# Patient Record
Sex: Female | Born: 1970 | Race: Black or African American | Hispanic: No | Smoking: Never smoker
Health system: Southern US, Community
[De-identification: ages and names within clinical notes are randomized; demographics above are authoritative.]

## PROBLEM LIST (undated history)

## (undated) DIAGNOSIS — G935 Compression of brain: Secondary | ICD-10-CM

## (undated) DIAGNOSIS — I10 Essential (primary) hypertension: Secondary | ICD-10-CM

## (undated) DIAGNOSIS — F32A Depression, unspecified: Secondary | ICD-10-CM

## (undated) DIAGNOSIS — F329 Major depressive disorder, single episode, unspecified: Secondary | ICD-10-CM

## (undated) DIAGNOSIS — N809 Endometriosis, unspecified: Secondary | ICD-10-CM

## (undated) HISTORY — DX: Endometriosis, unspecified: N80.9

## (undated) HISTORY — DX: Essential (primary) hypertension: I10

## (undated) HISTORY — DX: Major depressive disorder, single episode, unspecified: F32.9

## (undated) HISTORY — DX: Compression of brain: G93.5

## (undated) HISTORY — DX: Depression, unspecified: F32.A

## (undated) HISTORY — PX: HYSTEROSCOPY: SHX211

---

## 2000-02-03 ENCOUNTER — Other Ambulatory Visit: Admission: RE | Admit: 2000-02-03 | Discharge: 2000-02-03 | Payer: Self-pay | Admitting: Obstetrics & Gynecology

## 2002-06-16 ENCOUNTER — Emergency Department (HOSPITAL_COMMUNITY): Admission: EM | Admit: 2002-06-16 | Discharge: 2002-06-16 | Payer: Self-pay | Admitting: Emergency Medicine

## 2002-06-16 ENCOUNTER — Encounter: Payer: Self-pay | Admitting: Emergency Medicine

## 2002-08-16 ENCOUNTER — Other Ambulatory Visit: Admission: RE | Admit: 2002-08-16 | Discharge: 2002-08-16 | Payer: Self-pay | Admitting: Obstetrics and Gynecology

## 2003-03-20 ENCOUNTER — Encounter: Payer: Self-pay | Admitting: Emergency Medicine

## 2003-03-20 ENCOUNTER — Emergency Department (HOSPITAL_COMMUNITY): Admission: EM | Admit: 2003-03-20 | Discharge: 2003-03-20 | Payer: Self-pay | Admitting: Emergency Medicine

## 2003-03-21 ENCOUNTER — Ambulatory Visit (HOSPITAL_COMMUNITY): Admission: RE | Admit: 2003-03-21 | Discharge: 2003-03-21 | Payer: Self-pay | Admitting: Cardiovascular Disease

## 2003-03-21 ENCOUNTER — Encounter: Payer: Self-pay | Admitting: Cardiovascular Disease

## 2003-03-22 ENCOUNTER — Ambulatory Visit (HOSPITAL_COMMUNITY): Admission: RE | Admit: 2003-03-22 | Discharge: 2003-03-22 | Payer: Self-pay | Admitting: Cardiovascular Disease

## 2003-03-22 ENCOUNTER — Encounter: Payer: Self-pay | Admitting: Cardiovascular Disease

## 2004-01-30 ENCOUNTER — Other Ambulatory Visit: Admission: RE | Admit: 2004-01-30 | Discharge: 2004-01-30 | Payer: Self-pay | Admitting: Obstetrics and Gynecology

## 2004-03-28 HISTORY — PX: CERVICAL SPINE SURGERY: SHX589

## 2005-02-01 ENCOUNTER — Emergency Department (HOSPITAL_COMMUNITY): Admission: EM | Admit: 2005-02-01 | Discharge: 2005-02-01 | Payer: Self-pay | Admitting: Family Medicine

## 2008-01-02 ENCOUNTER — Other Ambulatory Visit: Admission: RE | Admit: 2008-01-02 | Discharge: 2008-01-02 | Payer: Self-pay | Admitting: Obstetrics & Gynecology

## 2008-07-11 ENCOUNTER — Other Ambulatory Visit: Admission: RE | Admit: 2008-07-11 | Discharge: 2008-07-11 | Payer: Self-pay | Admitting: Obstetrics and Gynecology

## 2009-02-26 HISTORY — PX: ABDOMINAL HYSTERECTOMY: SHX81

## 2009-03-06 ENCOUNTER — Encounter: Admission: RE | Admit: 2009-03-06 | Discharge: 2009-03-06 | Payer: Self-pay

## 2009-03-11 ENCOUNTER — Ambulatory Visit (HOSPITAL_COMMUNITY): Admission: RE | Admit: 2009-03-11 | Discharge: 2009-03-12 | Payer: Self-pay | Admitting: Obstetrics & Gynecology

## 2009-03-11 ENCOUNTER — Encounter: Payer: Self-pay | Admitting: Obstetrics & Gynecology

## 2010-10-02 LAB — BASIC METABOLIC PANEL
BUN: 4 mg/dL — ABNORMAL LOW (ref 6–23)
BUN: 6 mg/dL (ref 6–23)
CO2: 25 mEq/L (ref 19–32)
CO2: 29 mEq/L (ref 19–32)
Calcium: 8.4 mg/dL (ref 8.4–10.5)
Calcium: 9 mg/dL (ref 8.4–10.5)
Chloride: 104 mEq/L (ref 96–112)
Creatinine, Ser: 0.77 mg/dL (ref 0.4–1.2)
Creatinine, Ser: 0.81 mg/dL (ref 0.4–1.2)
GFR calc Af Amer: >60 mL/min (ref >60)
GFR calc non Af Amer: >60 mL/min (ref >60)
Glucose, Bld: 122 mg/dL — ABNORMAL HIGH (ref 70–99)
Glucose, Bld: 93 mg/dL (ref 70–99)
Sodium: 137 mEq/L (ref 135–145)

## 2010-10-02 LAB — CBC
MCHC: 35 g/dL (ref 30.0–36.0)
MCHC: 35.1 g/dL (ref 30.0–36.0)
MCV: 80.1 fL (ref 78.0–100.0)
Platelets: 229 10*3/uL (ref 150–400)
RBC: 4.4 MIL/uL (ref 3.87–5.11)
RDW: 14.9 % (ref 11.5–15.5)
RDW: 15.2 % (ref 11.5–15.5)

## 2010-10-02 LAB — TYPE AND SCREEN
ABO/RH(D): AB POS
Antibody Screen: NEGATIVE

## 2012-09-05 ENCOUNTER — Ambulatory Visit: Payer: BC Managed Care – PPO

## 2012-09-05 ENCOUNTER — Ambulatory Visit (INDEPENDENT_AMBULATORY_CARE_PROVIDER_SITE_OTHER): Payer: BC Managed Care – PPO | Admitting: Family Medicine

## 2012-09-05 VITALS — BP 134/92 | HR 95 | Temp 97.8°F | Resp 20 | Ht 67.0 in | Wt 215.0 lb

## 2012-09-05 DIAGNOSIS — M25579 Pain in unspecified ankle and joints of unspecified foot: Secondary | ICD-10-CM

## 2012-09-05 DIAGNOSIS — M25572 Pain in left ankle and joints of left foot: Secondary | ICD-10-CM

## 2012-09-05 MED ORDER — KETOROLAC TROMETHAMINE 60 MG/2ML IM SOLN
60.0000 mg | Freq: Once | INTRAMUSCULAR | Status: AC
  Administered 2012-09-05: 60 mg via INTRAMUSCULAR

## 2012-09-05 MED ORDER — OXYCODONE-ACETAMINOPHEN 5-325 MG PO TABS: 1.0000 | ORAL_TABLET | Freq: Three times a day (TID) | ORAL | Status: DC | PRN

## 2012-09-05 NOTE — Progress Notes (Signed)
Jenna Byrd is a 42 y.o. female who presents to [facility] today for left ankle injury. Patient was in her normal state of health today 2 hours prior to presentation. She was wearing clogs and suffered an ankle inversion injury. She noted immediate pain however worsening pain along the lateral aspect of her ankle and foot. Her pain has worsened and is becoming extremely and intolerable. She denies any fevers chills radiating pain weakness or numbness. She has suffered ankle sprains in the past and this is much more severe than that.   PMH: Reviewed hypertension History  Substance Use Topics  . Smoking status: Never Smoker   . Smokeless tobacco: Never Used  . Alcohol Use: No   ROS as above  Medications reviewed. Current Outpatient Prescriptions  Medication Sig Dispense Refill  . citalopram (CELEXA) 20 MG tablet Take 20 mg by mouth daily.      Marland Kitchen lisinopril-hydrochlorothiazide (PRINZIDE,ZESTORETIC) 10-12.5 MG per tablet Take 1 tablet by mouth daily.      . norethindrone-ethinyl estradiol (FEMHRT 1/5) 1-5 MG-MCG TABS Take by mouth daily.       Current Facility-Administered Medications  Medication Dose Route Frequency Trace Wirick Last Rate Last Dose  . ketorolac (TORADOL) injection 60 mg  60 mg Intramuscular Once Rodolph Bong, MD        Exam:  BP 134/92  Pulse 95  Temp(Src) 97.8 F (36.6 C) (Oral)  Resp 20  Ht 5\' 7"  (1.702 m)  Wt 215 lb (97.523 kg)  BMI 33.67 kg/m2  SpO2 98% Gen: Well NAD LEFT ANKLE: Mildly swollen laterally. Tender palpation of the lateral malleolus. Tender palpation over the base of the fifth metatarsal.  Pulses are intact dorsal pedis and posterior tibialis.  Capillary refill and sensation is intact.  Patient does not tolerate motion of the foot and does not tolerate active motion of the foot.     Three-view left foot: No fractures normal alignment 3V left ankle: No fractures normal alignment  Assessment and Plan: 42 y.o. female with ankle inversion injury. No  fractures however pain is extreme.  Pulses are intact as is sensation doubt for for serious neurovascular injury.  Unable to determine if occult les-frank or peroneal tendon disruption due to pain limiting exam.  Plan: Toradol shot now, Percocet, nonweightbearing. Followup with orthopedics in a few days.  Discussed warning signs or symptoms. Please see discharge instructions. Patient expresses understanding.

## 2012-09-05 NOTE — Patient Instructions (Addendum)
Thank you for coming in today. Use the crutches and the boot if able.  Please follow up with Dr .Farris Has at [facility] [address] on Friday or Monday. Call and make an appointment.  Take the Percocet tonight if needed. If your pain is unbearablely extreme tonight after taking Percocet go to the hospital.

## 2012-09-19 ENCOUNTER — Encounter: Payer: Self-pay | Admitting: Obstetrics & Gynecology

## 2012-09-19 ENCOUNTER — Ambulatory Visit: Payer: BC Managed Care – PPO | Admitting: Obstetrics & Gynecology

## 2012-10-06 ENCOUNTER — Telehealth: Payer: Self-pay | Admitting: *Deleted

## 2012-10-10 NOTE — Telephone Encounter (Signed)
Patient contacted to schedule needs annual exam

## 2012-12-08 ENCOUNTER — Other Ambulatory Visit: Payer: Self-pay | Admitting: Obstetrics & Gynecology

## 2012-12-08 NOTE — Telephone Encounter (Signed)
Patient has run out of her blood pressure meds and harmon

## 2012-12-08 NOTE — Telephone Encounter (Signed)
Refill request for JINTELI Last filled by MD on - 01/27/12 X 1 YEAR Last AEX - 08/23/11 Last Office Visit - 01/27/12 Next AEX - 01/03/13  I spoke to the pharmacist at [facility] and this RX has never been filled there or any other [facility].  Chart is on your desk.  Pt is also requesting refill on Lisinopril.  There is a note in her paper chart not to refill. Please advise.

## 2012-12-08 NOTE — Telephone Encounter (Signed)
She needs an appointment before having a refill.  She missed her last appointment or last several appts.

## 2012-12-12 NOTE — Telephone Encounter (Signed)
Pt was notified on 12/11/12 she will need to come to appt prior to appt.

## 2013-01-03 ENCOUNTER — Ambulatory Visit: Payer: BC Managed Care – PPO | Admitting: Obstetrics & Gynecology

## 2013-01-03 ENCOUNTER — Ambulatory Visit (INDEPENDENT_AMBULATORY_CARE_PROVIDER_SITE_OTHER): Payer: BC Managed Care – PPO | Admitting: Gynecology

## 2013-01-03 ENCOUNTER — Encounter: Payer: Self-pay | Admitting: Gynecology

## 2013-01-03 VITALS — BP 144/94 | HR 68 | Temp 98.1°F | Ht 67.5 in | Wt 247.0 lb

## 2013-01-03 DIAGNOSIS — I10 Essential (primary) hypertension: Secondary | ICD-10-CM

## 2013-01-03 DIAGNOSIS — Z01419 Encounter for gynecological examination (general) (routine) without abnormal findings: Secondary | ICD-10-CM

## 2013-01-03 DIAGNOSIS — Z0000 Encounter for general adult medical examination without abnormal findings: Secondary | ICD-10-CM

## 2013-01-03 DIAGNOSIS — F411 Generalized anxiety disorder: Secondary | ICD-10-CM

## 2013-01-03 DIAGNOSIS — N809 Endometriosis, unspecified: Secondary | ICD-10-CM

## 2013-01-03 LAB — POCT URINALYSIS DIPSTICK
Bilirubin, UA: NEGATIVE
Blood, UA: NEGATIVE
Nitrite, UA: NEGATIVE
Protein, UA: NEGATIVE
pH, UA: 5

## 2013-01-03 LAB — HEMOGLOBIN, FINGERSTICK: Hemoglobin, fingerstick: 14.2 g/dL (ref 12.0–16.0)

## 2013-01-03 MED ORDER — CITALOPRAM HYDROBROMIDE 20 MG PO TABS: 20.0000 mg | ORAL_TABLET | Freq: Every day | ORAL | Status: AC

## 2013-01-03 MED ORDER — NORETHINDRONE-ETH ESTRADIOL 1-5 MG-MCG PO TABS: 1.0000 | ORAL_TABLET | Freq: Every day | ORAL | Status: AC

## 2013-01-03 MED ORDER — LISINOPRIL-HYDROCHLOROTHIAZIDE 10-12.5 MG PO TABS: 1.0000 | ORAL_TABLET | Freq: Every day | ORAL | Status: AC

## 2013-01-03 NOTE — Progress Notes (Signed)
Patient ID: Jenna Byrd, female   DOB: [birth year], 42 y.o.   MRN: [id number] 42 y.o. divorsed African American female   [phone] here for annual exam. Pt is currently sexually active. Pt denies any dyspareunia, had pre-operatively, happy with FemHRT, no hot flashes or vaginal dryness.  Has run out of antihypertensive, has noticed more headaches since ran out. Would like refill on celexa.  No LMP recorded. Patient has had a hysterectomy.          Sexually active: yes  The current method of family planning is status post hysterectomy.    Exercising: no  The patient does not participate in regular exercise at present. Last pap: 02/06/09 Mammogram-none Alcohol: 4 drinks monthly Tobacco:none BSE: monthly    Health Maintenance  Topic Date Due  . Pap Smear  10/17/1986  . Tetanus/tdap  10/17/1987  . Influenza Vaccine  02/26/2013    Family History  Problem Relation Age of Onset  . Diabetes Mother   . Stroke Mother   . Diabetes Maternal Grandfather   . Hypertension Mother   . Heart disease Father     MI  . Dementia Maternal Grandmother   . Stroke Father     Patient Active Problem List   Diagnosis Date Noted  . Pain in joint, ankle and foot 09/05/2012    Past Medical History  Diagnosis Date  . Endometriosis   . Chiari malformation type I   . Depression     Past Surgical History  Procedure Laterality Date  . Abdominal hysterectomy  9/10    TLH/BSO stage iv endometriosis  . Hysteroscopy      and polyp resection  . Cervical spine surgery  10/05    decompression surgery with removal c1, fusion c2-c3    Allergies: Adhesive; Ivp dye; and Shellfish allergy  Current Outpatient Prescriptions  Medication Sig Dispense Refill  . citalopram (CELEXA) 20 MG tablet Take 20 mg by mouth daily.      Marland Kitchen lisinopril-hydrochlorothiazide (PRINZIDE,ZESTORETIC) 10-12.5 MG per tablet Take 1 tablet by mouth daily.      . norethindrone-ethinyl estradiol (FEMHRT 1/5) 1-5 MG-MCG TABS Take by  mouth daily.      Marland Kitchen oxyCODONE-acetaminophen (ROXICET) 5-325 MG per tablet Take 1 tablet by mouth every 8 (eight) hours as needed for pain.  20 tablet  0   No current facility-administered medications for this visit.    ROS: Pertinent items are noted in HPI.  Exam:    BP 144/94  Pulse 68  Temp(Src) 98.1 F (36.7 C) (Oral)  Ht 5' 7.5" (1.715 m)  Wt 247 lb (112.038 kg)  BMI 38.09 kg/m2 Weight change: @WEIGHTCHANGE @ Last 3 height recordings:  Ht Readings from Last 3 Encounters:  01/03/13 5' 7.5" (1.715 m)  09/05/12 5\' 7"  (1.702 m)   General appearance: alert, cooperative and appears stated age Head: Normocephalic, without obvious abnormality, atraumatic Neck: no adenopathy, no carotid bruit, no JVD, supple, symmetrical, trachea midline and thyroid not enlarged, symmetric, no tenderness/mass/nodules Lungs: clear to auscultation bilaterally Breasts: normal appearance, no masses or tenderness Heart: regular rate and rhythm, S1, S2 normal, no murmur, click, rub or gallop Abdomen: soft, non-tender; bowel sounds normal; no masses,  no organomegaly Extremities: extremities normal, atraumatic, no cyanosis or edema Skin: Skin color, texture, turgor normal. No rashes or lesions Lymph nodes: Cervical, supraclavicular, and axillary nodes normal. no inguinal nodes palpated Neurologic: Grossly normal   Pelvic: External genitalia:  no lesions  Urethra: normal appearing urethra with no masses, tenderness or lesions              Bartholins and Skenes: normal                 Vagina: normal appearing vagina with normal color and discharge, no lesions              Cervix: absent              Pap taken: no        Bimanual Exam:  Uterus:  absent                                      Adnexa:    no masses and surgically absent bilateral                                      Rectovaginal: Confirms, no utersosacral tenderness                                      Anus:  normal sphincter tone,  no lesions  A: well woman no contraindication to continue hormonal therapy Contraceptive management     P: mammogram stressed Refill on antihypertensives- stressed importance of bp check, rto 83m Refill celexa Refill femHRT return annually or prn   An After Visit Summary was printed and given to the patient.

## 2013-01-03 NOTE — Patient Instructions (Signed)

## 2013-04-03 ENCOUNTER — Encounter: Payer: Self-pay | Admitting: Obstetrics & Gynecology

## 2013-04-06 ENCOUNTER — Ambulatory Visit: Payer: BC Managed Care – PPO | Admitting: Obstetrics & Gynecology

## 2013-04-09 ENCOUNTER — Ambulatory Visit: Payer: BC Managed Care – PPO | Admitting: Gynecology

## 2013-04-13 ENCOUNTER — Ambulatory Visit: Payer: BC Managed Care – PPO | Admitting: Gynecology

## 2013-04-13 ENCOUNTER — Encounter: Payer: Self-pay | Admitting: Gynecology

## 2013-04-13 ENCOUNTER — Telehealth: Payer: Self-pay | Admitting: Gynecology

## 2013-04-13 NOTE — Telephone Encounter (Signed)
Patient dnka her 3 mo recheck appointment. I was unable to leave a message, letter mailed to patient.

## 2013-04-16 ENCOUNTER — Telehealth: Payer: Self-pay | Admitting: Emergency Medicine

## 2013-04-16 NOTE — Telephone Encounter (Signed)
Attempted phone call.  Unable to leave Voicemail.  ""the mailbox is full and cannot accept any new messages at this time. goodbye" Will have to try again.

## 2013-04-16 NOTE — Telephone Encounter (Signed)
Message copied by Joeseph Amor on Mon Apr 16, 2013 11:46 AM ------      Message from: Douglass Rivers      Created: Sun Apr 15, 2013 11:13 AM       Needs f/u on BP medication started here ------

## 2013-05-08 NOTE — Telephone Encounter (Signed)
Attempted phone call again.  "The mailbox is full and cannot accept any new messages at this time. goodbye"

## 2013-05-29 ENCOUNTER — Encounter: Payer: Self-pay | Admitting: Emergency Medicine

## 2013-05-29 NOTE — Telephone Encounter (Signed)
Unable to reach patient. Letter sent as well.

## 2013-08-02 ENCOUNTER — Encounter: Payer: Self-pay | Admitting: Gynecology

## 2014-02-08 IMAGING — CR DG FOOT COMPLETE 3+V*L*
3 series · 3 of 3 positions shown · non-contrast
Comparison: None.

CLINICAL DATA: Left foot pain

LEFT FOOT - COMPLETE 3+ VIEW

[AP]
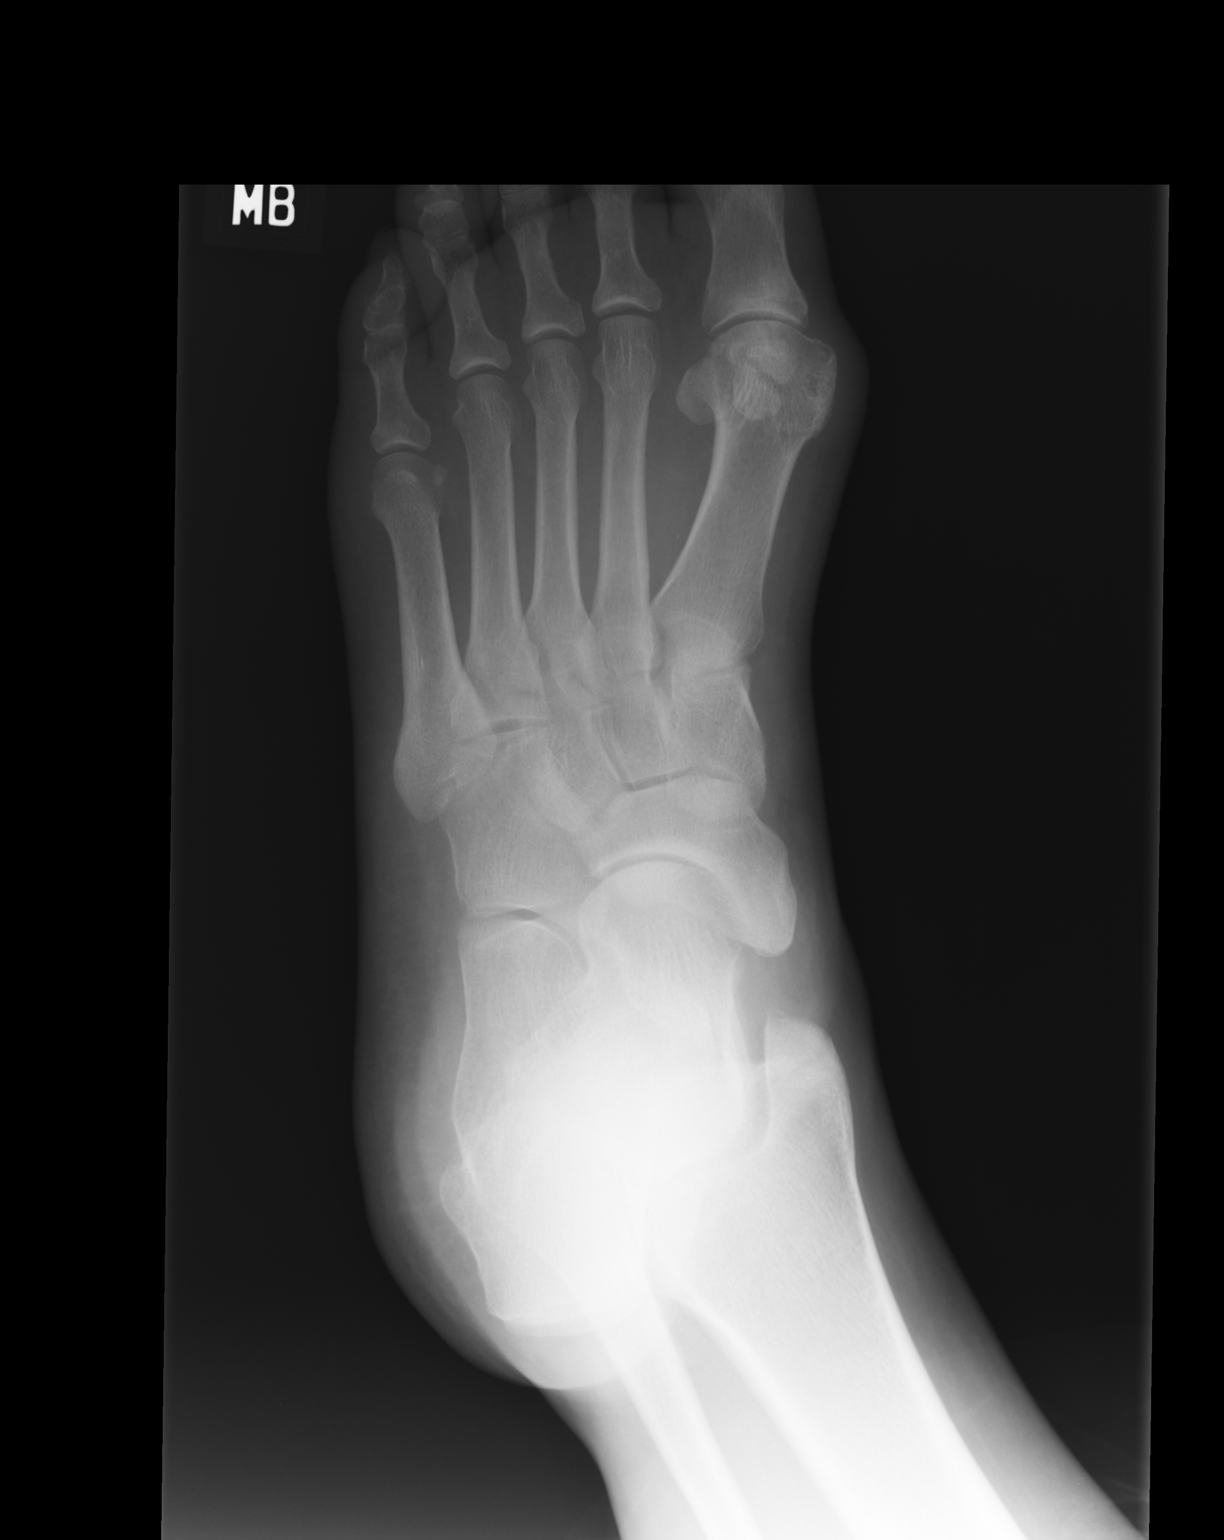

[ap obl int rot]
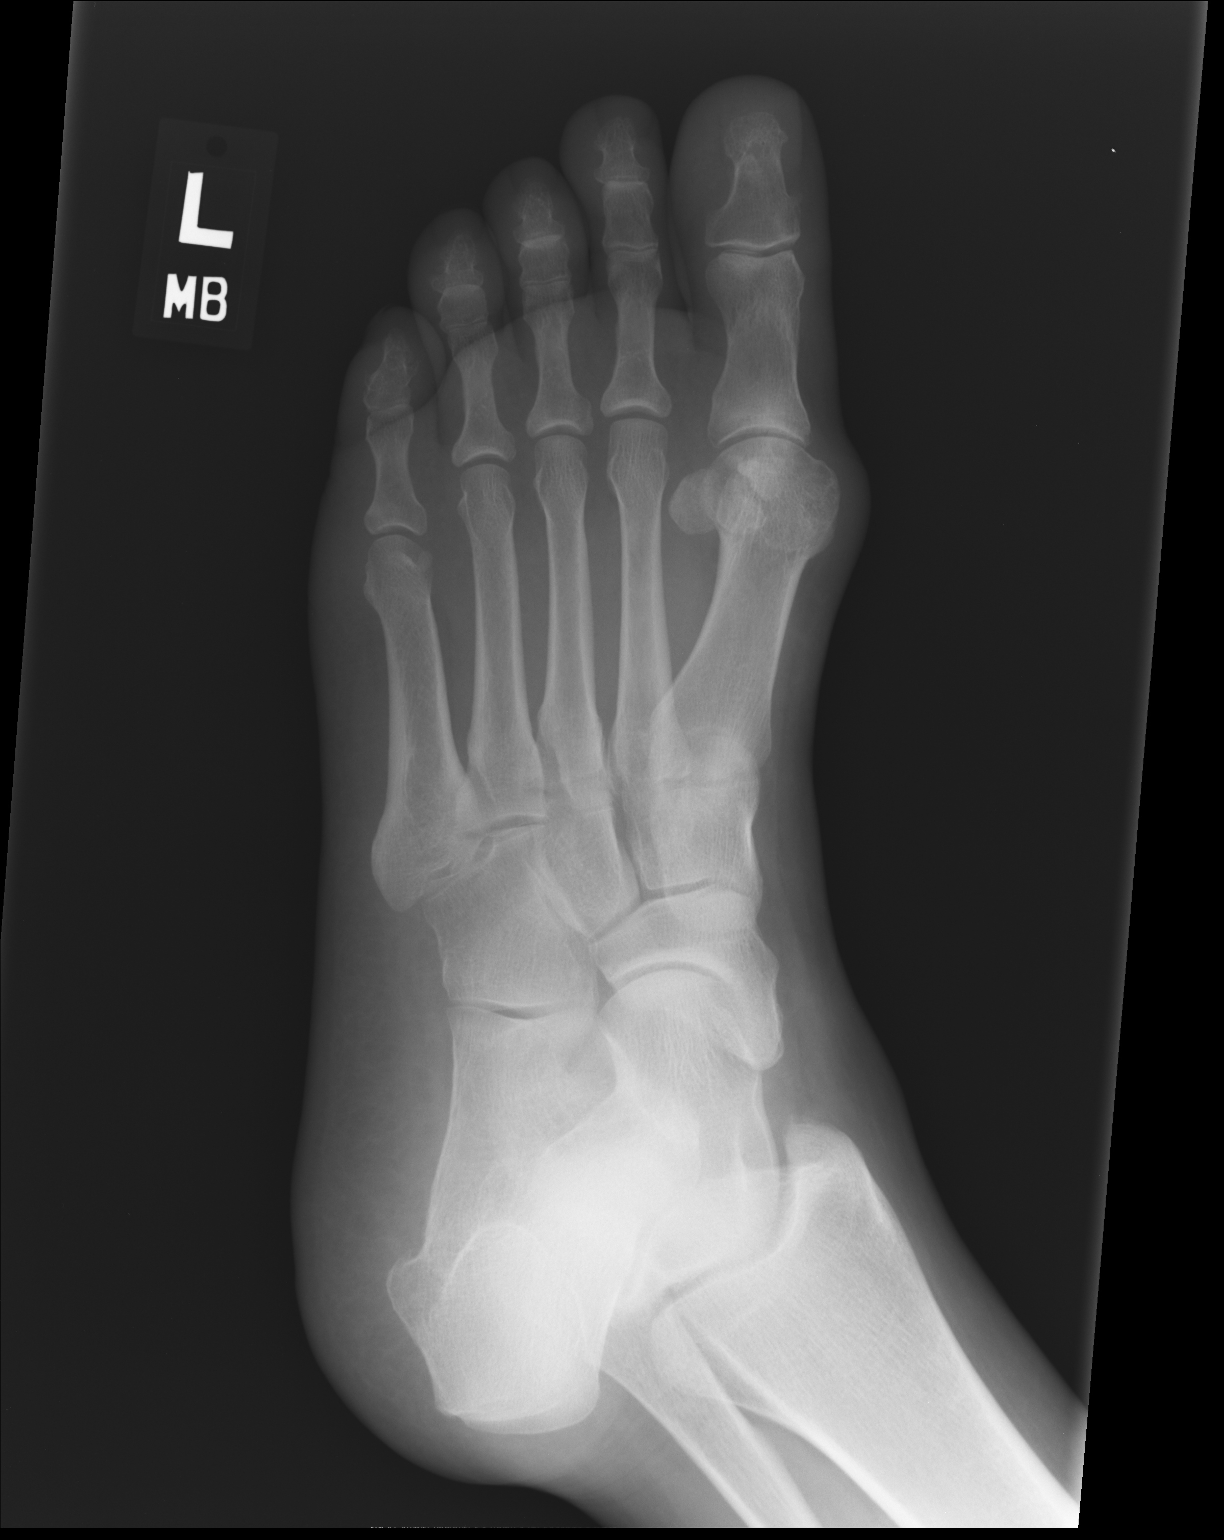

[lateral]
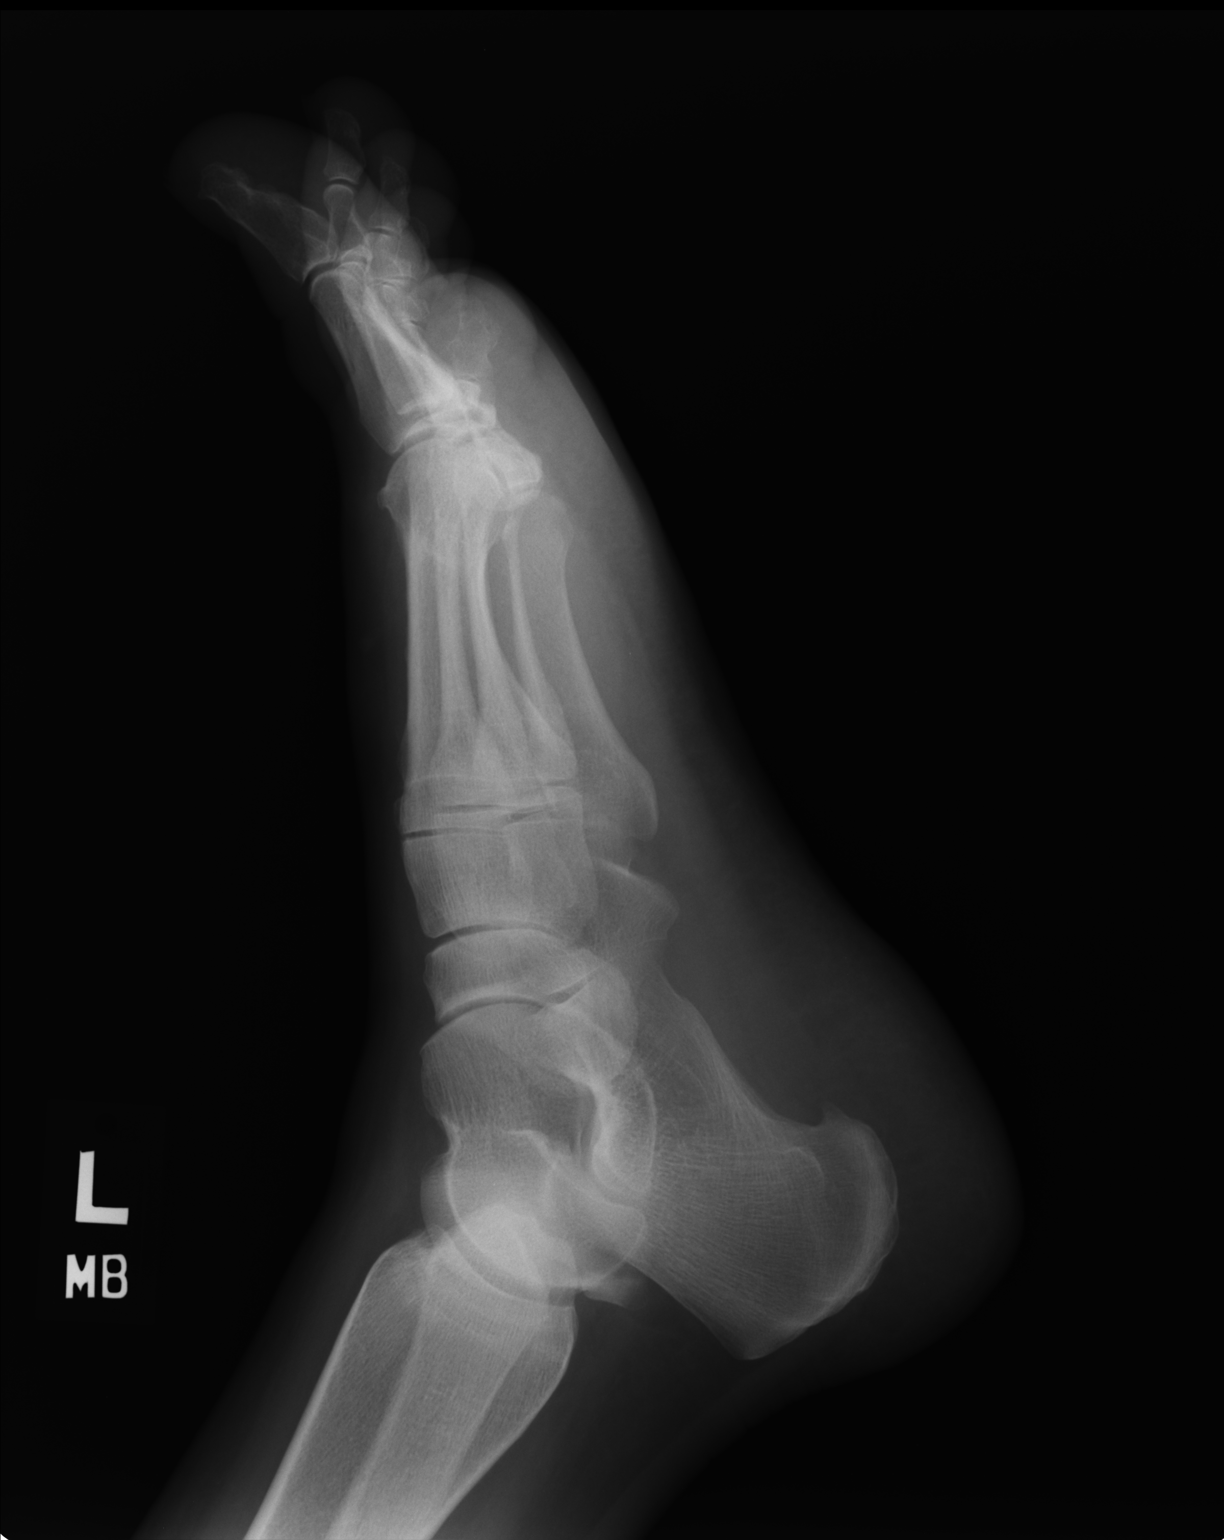

[3 of 3 positions shown; findings below may reference images not displayed]

FINDINGS: Great toe is omitted from the frontal projection.  No
fracture or dislocation.  No radiopaque foreign body.  Plantar
pectineal spurring noted.  No soft tissue abnormality.
IMPRESSION: No acute finding.

## 2014-02-20 ENCOUNTER — Encounter: Payer: Self-pay | Admitting: Obstetrics & Gynecology

## 2014-03-15 ENCOUNTER — Ambulatory Visit: Payer: BC Managed Care – PPO | Admitting: Obstetrics & Gynecology

## 2014-04-29 ENCOUNTER — Encounter: Payer: Self-pay | Admitting: Gynecology

## 2014-05-22 ENCOUNTER — Ambulatory Visit: Payer: BC Managed Care – PPO | Admitting: Obstetrics & Gynecology

## 2014-05-22 ENCOUNTER — Encounter: Payer: Self-pay | Admitting: Obstetrics & Gynecology

## 2014-06-17 ENCOUNTER — Encounter: Payer: Self-pay | Admitting: Obstetrics & Gynecology

## 2016-09-12 ENCOUNTER — Inpatient Hospital Stay (HOSPITAL_COMMUNITY): Payer: BC Managed Care – PPO

## 2016-09-12 ENCOUNTER — Observation Stay (HOSPITAL_COMMUNITY)
Admission: EM | Admit: 2016-09-12 | Discharge: 2016-09-13 | DRG: 093 | Disposition: A | Discharge status: Home / Self Care | Payer: BC Managed Care – PPO | Attending: Family Medicine | Admitting: Family Medicine

## 2016-09-12 ENCOUNTER — Emergency Department (HOSPITAL_COMMUNITY): Payer: BC Managed Care – PPO

## 2016-09-12 ENCOUNTER — Encounter (HOSPITAL_COMMUNITY): Payer: Self-pay | Admitting: Emergency Medicine

## 2016-09-12 DIAGNOSIS — E669 Obesity, unspecified: Secondary | ICD-10-CM | POA: Diagnosis not present

## 2016-09-12 DIAGNOSIS — R531 Weakness: Secondary | ICD-10-CM | POA: Diagnosis not present

## 2016-09-12 DIAGNOSIS — Z823 Family history of stroke: Secondary | ICD-10-CM

## 2016-09-12 DIAGNOSIS — Z833 Family history of diabetes mellitus: Secondary | ICD-10-CM

## 2016-09-12 DIAGNOSIS — Z6836 Body mass index (BMI) 36.0-36.9, adult: Secondary | ICD-10-CM

## 2016-09-12 DIAGNOSIS — F329 Major depressive disorder, single episode, unspecified: Secondary | ICD-10-CM | POA: Diagnosis present

## 2016-09-12 DIAGNOSIS — R202 Paresthesia of skin: Secondary | ICD-10-CM | POA: Diagnosis present

## 2016-09-12 DIAGNOSIS — R200 Anesthesia of skin: Secondary | ICD-10-CM | POA: Diagnosis not present

## 2016-09-12 DIAGNOSIS — F411 Generalized anxiety disorder: Secondary | ICD-10-CM | POA: Diagnosis present

## 2016-09-12 DIAGNOSIS — Z8249 Family history of ischemic heart disease and other diseases of the circulatory system: Secondary | ICD-10-CM | POA: Diagnosis not present

## 2016-09-12 DIAGNOSIS — I10 Essential (primary) hypertension: Secondary | ICD-10-CM | POA: Diagnosis present

## 2016-09-12 DIAGNOSIS — R51 Headache: Secondary | ICD-10-CM | POA: Diagnosis present

## 2016-09-12 DIAGNOSIS — Z91013 Allergy to seafood: Secondary | ICD-10-CM

## 2016-09-12 DIAGNOSIS — I639 Cerebral infarction, unspecified: Secondary | ICD-10-CM | POA: Diagnosis not present

## 2016-09-12 DIAGNOSIS — R29898 Other symptoms and signs involving the musculoskeletal system: Secondary | ICD-10-CM | POA: Diagnosis present

## 2016-09-12 LAB — I-STAT CHEM 8, ED
BUN: 11 mg/dL (ref 6–20)
CALCIUM ION: 1.14 mmol/L — AB (ref 1.15–1.40)
CHLORIDE: 105 mmol/L (ref 101–111)
CREATININE: 0.9 mg/dL (ref 0.44–1.00)
GLUCOSE: 120 mg/dL — AB (ref 65–99)
HCT: 38 % (ref 36.0–46.0)
Hemoglobin: 12.9 g/dL (ref 12.0–15.0)
Potassium: 3.7 mmol/L (ref 3.5–5.1)
Sodium: 142 mmol/L (ref 135–145)
TCO2: 27 mmol/L (ref 0–100)

## 2016-09-12 LAB — PROTIME-INR
INR: 0.83
PROTHROMBIN TIME: 11.4 s (ref 11.4–15.2)

## 2016-09-12 LAB — I-STAT TROPONIN, ED: TROPONIN I, POC: 0 ng/mL (ref 0.00–0.08)

## 2016-09-12 LAB — COMPREHENSIVE METABOLIC PANEL
ALBUMIN: 4.2 g/dL (ref 3.5–5.0)
ALT: 35 U/L (ref 14–54)
AST: 31 U/L (ref 15–41)
Alkaline Phosphatase: 65 U/L (ref 38–126)
Anion gap: 5 (ref 5–15)
BILIRUBIN TOTAL: 0.9 mg/dL (ref 0.3–1.2)
BUN: 11 mg/dL (ref 6–20)
CALCIUM: 9.1 mg/dL (ref 8.9–10.3)
CO2: 26 mmol/L (ref 22–32)
Chloride: 108 mmol/L (ref 101–111)
Creatinine, Ser: 0.83 mg/dL (ref 0.44–1.00)
GFR calc non Af Amer: >60 mL/min (ref >60)
Glucose, Bld: 121 mg/dL — ABNORMAL HIGH (ref 65–99)
Potassium: 4.1 mmol/L (ref 3.5–5.1)
SODIUM: 139 mmol/L (ref 135–145)
TOTAL PROTEIN: 7.6 g/dL (ref 6.5–8.1)

## 2016-09-12 LAB — CBC
HCT: 36.5 % (ref 36.0–46.0)
HCT: 37.3 % (ref 36.0–46.0)
HEMOGLOBIN: 13.4 g/dL (ref 12.0–15.0)
Hemoglobin: 13.3 g/dL (ref 12.0–15.0)
MCH: 25.6 pg — AB (ref 26.0–34.0)
MCH: 26.2 pg (ref 26.0–34.0)
MCHC: 36.4 g/dL — ABNORMAL HIGH (ref 30.0–36.0)
MCHC: 35.9 g/dL (ref 30.0–36.0)
MCV: 70.3 fL — ABNORMAL LOW (ref 78.0–100.0)
MCV: 72.9 fL — ABNORMAL LOW (ref 78.0–100.0)
Platelets: 263 10*3/uL (ref 150–400)
RBC: 5.19 MIL/uL — ABNORMAL HIGH (ref 3.87–5.11)
RBC: 5.12 MIL/uL — ABNORMAL HIGH (ref 3.87–5.11)
RDW: 15.6 % — AB (ref 11.5–15.5)
RDW: 16.2 % — AB (ref 11.5–15.5)
WBC: 9.2 10*3/uL (ref 4.0–10.5)
WBC: 9 10*3/uL (ref 4.0–10.5)

## 2016-09-12 LAB — CREATININE, SERUM
CREATININE: 0.76 mg/dL (ref 0.44–1.00)
GFR calc Af Amer: >60 mL/min (ref >60)
GFR calc non Af Amer: >60 mL/min (ref >60)

## 2016-09-12 LAB — DIFFERENTIAL
BASOS ABS: 0 10*3/uL (ref 0.0–0.1)
BASOS PCT: 0 %
EOS ABS: 0.1 10*3/uL (ref 0.0–0.7)
Eosinophils Relative: 1 %
Lymphocytes Relative: 18 %
Lymphs Abs: 1.7 10*3/uL (ref 0.7–4.0)
MONOS PCT: 10 %
Monocytes Absolute: 0.9 10*3/uL (ref 0.1–1.0)
NEUTROS ABS: 6.5 10*3/uL (ref 1.7–7.7)
NEUTROS PCT: 71 %

## 2016-09-12 LAB — ETHANOL: Alcohol, Ethyl (B): <5 mg/dL (ref <5)

## 2016-09-12 LAB — APTT: aPTT: 28 seconds (ref 24–36)

## 2016-09-12 MED ORDER — ACETAMINOPHEN 160 MG/5ML PO SOLN: 650.0000 mg | ORAL | Status: DC | PRN

## 2016-09-12 MED ORDER — HYDRALAZINE HCL 20 MG/ML IJ SOLN: 10.0000 mg | Freq: Three times a day (TID) | INTRAMUSCULAR | Status: DC | PRN

## 2016-09-12 MED ORDER — STROKE: EARLY STAGES OF RECOVERY BOOK
Freq: Once | Status: AC
  Administered 2016-09-12: 1
  Filled 2016-09-12: qty 1

## 2016-09-12 MED ORDER — SODIUM CHLORIDE 0.9 % IV SOLN
INTRAVENOUS | Status: AC
  Administered 2016-09-12: 13:00:00 via INTRAVENOUS

## 2016-09-12 MED ORDER — ASPIRIN 325 MG PO TABS
325.0000 mg | ORAL_TABLET | Freq: Once | ORAL | Status: AC
  Administered 2016-09-12: 325 mg via ORAL
  Filled 2016-09-12: qty 1

## 2016-09-12 MED ORDER — HEPARIN SODIUM (PORCINE) 5000 UNIT/ML IJ SOLN
5000.0000 [IU] | Freq: Three times a day (TID) | INTRAMUSCULAR | Status: DC
  Administered 2016-09-12 – 2016-09-13 (×4): 5000 [IU] via SUBCUTANEOUS
  Filled 2016-09-12 (×4): qty 1

## 2016-09-12 MED ORDER — ACETAMINOPHEN 325 MG PO TABS: 650.0000 mg | ORAL_TABLET | ORAL | Status: DC | PRN

## 2016-09-12 MED ORDER — ACETAMINOPHEN 650 MG RE SUPP: 650.0000 mg | RECTAL | Status: DC | PRN

## 2016-09-12 MED ORDER — SENNOSIDES-DOCUSATE SODIUM 8.6-50 MG PO TABS: 1.0000 | ORAL_TABLET | Freq: Every evening | ORAL | Status: DC | PRN

## 2016-09-12 MED ORDER — LORAZEPAM 2 MG/ML IJ SOLN: 1.0000 mg | Freq: Four times a day (QID) | INTRAMUSCULAR | Status: DC | PRN

## 2016-09-12 NOTE — ED Provider Notes (Addendum)
[facility]-EMERGENCY DEPT Provider Note   CSN: [id number] Arrival date & time: 09/12/16  4098     History   Chief Complaint Chief Complaint  Patient presents with  . Headache  . Weakness    HPI Jenna Byrd is a 46 y.o. female.  Level V caveat for urgent need for intervention. Patient awoke this morning with a right parietal occipital headache. At approximately [phone], she felt numbness and tingling in her left face, left arm, left leg. Her left arm feels weaker than normal. She has a known history of hypertension, but does not take her medications. Family history positive for stroke.      Past Medical History:  Diagnosis Date  . Chiari malformation type I (HCC)   . Depression   . Endometriosis   . Hypertension     Patient Active Problem List   Diagnosis Date Noted  . Left arm weakness 09/12/2016  . Essential hypertension, benign 01/03/2013  . Endometriosis 01/03/2013  . Anxiety state, unspecified 01/03/2013  . Pain in joint, ankle and foot 09/05/2012    Past Surgical History:  Procedure Laterality Date  . ABDOMINAL HYSTERECTOMY  9/10   TLH/BSO stage iv endometriosis  . CERVICAL SPINE SURGERY  10/05   decompression surgery with removal c1, fusion c2-c3  . HYSTEROSCOPY     and polyp resection    OB History    Gravida Para Term Preterm AB Living   3       2 1    SAB TAB Ectopic Multiple Live Births                   Home Medications    Prior to Admission medications   Medication Sig Start Date End Date Taking? Authorizing Provider  citalopram (CELEXA) 20 MG tablet Take 1 tablet (20 mg total) by mouth daily. 01/03/13   Douglass Rivers, MD  lisinopril-hydrochlorothiazide (PRINZIDE,ZESTORETIC) 10-12.5 MG per tablet Take 1 tablet by mouth daily. 01/03/13   Douglass Rivers, MD  norethindrone-ethinyl estradiol (FEMHRT 1/5) 1-5 MG-MCG TABS Take 1 tablet by mouth daily. 01/03/13   Douglass Rivers, MD    Family History Family History  Problem Relation Age of Onset   . Diabetes Mother   . Stroke Mother   . Hypertension Mother   . Heart disease Father     MI  . Stroke Father   . Cancer Maternal Aunt     deceased - uterine  . Diabetes Maternal Grandfather   . Dementia Maternal Grandmother     Social History Social History  Substance Use Topics  . Smoking status: Never Smoker  . Smokeless tobacco: Never Used  . Alcohol use Yes     Comment: 4 per month     Allergies   Shellfish allergy; Ivp dye [iodinated diagnostic agents]; and Adhesive [tape]   Review of Systems Review of Systems  Reason unable to perform ROS: Urgent need for intervention.     Physical Exam Updated Vital Signs BP (!) 154/109 (BP Location: Right Arm)   Pulse 75   Temp 98.3 F (36.8 C) (Oral)   Resp 18   Ht 5' 7.5" (1.715 m)   Wt 234 lb (106.1 kg)   SpO2 100%   BMI 36.11 kg/m   Physical Exam  Constitutional: She is oriented to person, place, and time. She appears well-developed and well-nourished.  Patient is alert and oriented 3; no facial asymmetry  HENT:  Head: Normocephalic and atraumatic.  Eyes: Conjunctivae are normal.  Neck: Neck supple.  Cardiovascular: Normal rate and regular rhythm.   Pulmonary/Chest: Effort normal and breath sounds normal.  Abdominal: Soft. Bowel sounds are normal.  Musculoskeletal: Normal range of motion.  Neurological: She is alert and oriented to person, place, and time.  No obvious gross neurological deficits; however, patient states her left arm is weaker than her right arm  Skin: Skin is warm and dry.  Psychiatric: She has a normal mood and affect. Her behavior is normal.  Nursing note and vitals reviewed.    ED Treatments / Results  Labs (all labs ordered are listed, but only abnormal results are displayed) Labs Reviewed  CBC - Abnormal; Notable for the following:       Result Value   RBC 5.19 (*)    MCV 70.3 (*)    MCH 25.6 (*)    MCHC 36.4 (*)    RDW 15.6 (*)    All other components within normal limits   I-STAT CHEM 8, ED - Abnormal; Notable for the following:    Glucose, Bld 120 (*)    Calcium, Ion 1.14 (*)    All other components within normal limits  ETHANOL  PROTIME-INR  APTT  DIFFERENTIAL  COMPREHENSIVE METABOLIC PANEL  RAPID URINE DRUG SCREEN, HOSP PERFORMED  URINALYSIS, ROUTINE W REFLEX MICROSCOPIC  I-STAT TROPOININ, ED  POC URINE PREG, ED    EKG  EKG Interpretation None       Radiology Ct Head Wo Contrast  Result Date: 09/12/2016 CLINICAL DATA:  Severe headache. Left-sided facial tingling. Left arm and left leg tingling. EXAM: CT HEAD WITHOUT CONTRAST TECHNIQUE: Contiguous axial images were obtained from the base of the skull through the vertex without intravenous contrast. COMPARISON:  Cervical MRI 03/06/2009 FINDINGS: Brain: Previous suboccipital craniectomy for treatment of Chiari malformation. No sign of old or acute infarction, mass lesion, hemorrhage, hydrocephalus or extra-axial collection. Vascular: No abnormal vascular finding. Skull: Negative except for sub occipital craniectomy. Sinuses/Orbits: Negative except for retention cyst right maxillary sinus. Orbits negative. Other: None IMPRESSION: No acute finding. Previous suboccipital craniectomy for treatment of Chiari malformation. These results were called by telephone at the time of interpretation on 09/12/2016 at 10:21 am to Dr. Donnetta Hutching , who verbally acknowledged these results. Electronically Signed   By: Paulina Fusi M.D.   On: 09/12/2016 10:21    Procedures Procedures (including critical care time)  Medications Ordered in ED Medications - No data to display   Initial Impression / Assessment and Plan / ED Course  I have reviewed the triage vital signs and the nursing notes.  Pertinent labs & imaging results that were available during my care of the patient were reviewed by me and considered in my medical decision making (see chart for details).    CRITICAL CARE Performed by: Donnetta Hutching Total  critical care time: 40 minutes Critical care time was exclusive of separately billable procedures and treating other patients. Critical care was necessary to treat or prevent imminent or life-threatening deterioration. Critical care was time spent personally by me on the following activities: development of treatment plan with patient and/or surrogate as well as nursing, discussions with consultants, evaluation of patient's response to treatment, examination of patient, obtaining history from patient or surrogate, ordering and performing treatments and interventions, ordering and review of laboratory studies, ordering and review of radiographic studies, pulse oximetry and re-evaluation of patient's condition.  Patient presents with right occipital temporal headache and left sided numbness and tingling and left arm weakness. Physical exam did not appear to be  focal.   I initiated a code stroke immediately upon examining this patient.   Initially I spoke with the neurologist on-call Dr Cena BentonVega.  He recommended the initiation of TPA after the initial CT scan was obtained and reported. I agreed with this assessment.    The [facility] transport team was able to transport the patient immediately after the CT scan was obtained. I obviously had no report at my beckoning. I called the radiology department to hasten the reading time. I also personally reviewed the CT head. Since the transferring hospital was only 5 minutes away, I made the decision to transport the patient to a higher level care immediately. I discussed the clinical scenario with the operator and the [facility], the charge nurse at the neuro ICU named Aundra MilletMegan, and the neurologist Dr. Cena BentonVega. In my opinion, there would have been an increased delay while [facility] than [facility].  Final Clinical Impressions(s) / ED Diagnoses   Final  diagnoses:  Left arm weakness  Left leg weakness    New Prescriptions New Prescriptions   No medications on file     Donnetta HutchingBrian Tyquan Carmickle, MD 09/12/16 1053    Donnetta HutchingBrian Genni Buske, MD 09/12/16 1110

## 2016-09-12 NOTE — ED Triage Notes (Signed)
Pt c/o severe headache, pt c/o L sided tingling in face, L arm and LLE. Pt states she woke with a headache and then tingling has worsened throughout the morning. Pt with equal strength in bilateral upper and lower extremities, no facial droop, pt states numbness to L face when touched.

## 2016-09-12 NOTE — ED Notes (Signed)
Bed: [id number] Expected date:  Expected time:  Means of arrival:  Comments: Hold for triage Fake

## 2016-09-12 NOTE — H&P (Signed)
[facility] History and Physical  Jenna Byrd ZOX:[id number] DOB: 01-31-1971 DOA: 09/12/2016  Referring physician:  PCP: No PCP Per Patient   Chief Complaint: "I woke up with my left side feeling weird."  HPI: Jenna Byrd is a 46 y.o. female  with past medical history of Chiari malformation type I with resection, depression, endometriosis, hypertension who presented to the emergency room with chief complaint of left-sided numbness and weakness at [facility]. Patient states she went to bed at around 3 AM and woke up at around 7:30a with these symptoms. Patient has a family history of stroke. So she took herself immediately to the emergency room to be evaluated.  ED course: Per chart review TPA advised. Spoke to EDP and TPA not given. Spoke to Dr. Cena Benton with neurology who states that patient was outside the window when he saw the patient so the order to give TPA was cancelled. Requested transfer out of [facility] ICU and to neuro floor with hospitalists as primary.   Review of Systems:  As per HPI otherwise 10 point review of systems negative.    Past Medical History:  Diagnosis Date  . Chiari malformation type I (HCC)   . Depression   . Endometriosis   . Hypertension    Past Surgical History:  Procedure Laterality Date  . ABDOMINAL HYSTERECTOMY  9/10   TLH/BSO stage iv endometriosis  . CERVICAL SPINE SURGERY  10/05   decompression surgery with removal c1, fusion c2-c3  . HYSTEROSCOPY     and polyp resection   Social History:  reports that she has never smoked. She has never used smokeless tobacco. She reports that she drinks alcohol. She reports that she does not use drugs.  Allergies  Allergen Reactions  . Shellfish Allergy Anaphylaxis  . Ivp Dye [Iodinated Diagnostic Agents]   . Adhesive [Tape] Rash    Family History  Problem Relation Age of Onset  . Diabetes Mother   . Stroke Mother   . Hypertension Mother   . Heart disease Father     MI    . Stroke Father   . Cancer Maternal Aunt     deceased - uterine  . Diabetes Maternal Grandfather   . Dementia Maternal Grandmother      Prior to Admission medications   Medication Sig Start Date End Date Taking? Authorizing Provider  citalopram (CELEXA) 20 MG tablet Take 1 tablet (20 mg total) by mouth daily. 01/03/13   Douglass Rivers, MD  lisinopril-hydrochlorothiazide (PRINZIDE,ZESTORETIC) 10-12.5 MG per tablet Take 1 tablet by mouth daily. 01/03/13   Douglass Rivers, MD  norethindrone-ethinyl estradiol (FEMHRT 1/5) 1-5 MG-MCG TABS Take 1 tablet by mouth daily. 01/03/13   Douglass Rivers, MD   Physical Exam: Vitals:   09/12/16 1200 09/12/16 1215 09/12/16 1300 09/12/16 1500  BP:  (!) 164/98  (!) 160/92  Pulse: 75 76  77  Resp: 17 18 16 17   Temp:      TempSrc:      SpO2: 95% 95%  96%  Weight:      Height:        Wt Readings from Last 3 Encounters:  09/12/16 106.1 kg (234 lb)  09/12/16 106.1 kg (234 lb)  01/03/13 112 kg (247 lb)    General:  Appears calm and comfortable, Alert and oriented 3 Eyes:  PERRL, EOMI, normal lids, iris ENT:  grossly normal hearing, lips & tongue Neck:  no LAD, masses or thyromegaly Cardiovascular:  RRR, no m/r/g. No LE edema.  Respiratory:  CTA bilaterally, no w/r/r. Normal respiratory effort. Abdomen:  soft, ntnd Skin:  no rash or induration seen on limited exam Musculoskeletal:  grossly normal tone BUE/BLE Psychiatric:  grossly normal mood and affect, speech fluent and appropriate Neurologic:  CN 2-12 grossly intact, moves all extremities in coordinated fashion. left lower extremity weakness with dorsi and plantar flexion of foot, sensation to light touch also decreased in left foot; normal heel-to-shin on the left but shaky motion when done with right leg, normal finger-nose-finger           Labs on Admission:  Basic Metabolic Panel:  Recent Labs Lab 09/12/16 0943 09/12/16 0953 09/12/16 1136  NA 139 142  --   K 4.1 3.7  --   CL 108 105  --    CO2 26  --   --   GLUCOSE 121* 120*  --   BUN 11 11  --   CREATININE 0.83 0.90 0.76  CALCIUM 9.1  --   --    Liver Function Tests:  Recent Labs Lab 09/12/16 0943  AST 31  ALT 35  ALKPHOS 65  BILITOT 0.9  PROT 7.6  ALBUMIN 4.2   No results for input(s): LIPASE, AMYLASE in the last 168 hours. No results for input(s): AMMONIA in the last 168 hours. CBC:  Recent Labs Lab 09/12/16 0943 09/12/16 0953 09/12/16 1136  WBC 9.2  --  PENDING  NEUTROABS 6.5  --   --   HGB 13.3 12.9 13.4  HCT 36.5 38.0 37.3  MCV 70.3*  --  72.9*  PLT 263  --  PLATELET CLUMPS NOTED ON SMEAR, COUNT APPEARS INCREASED   Cardiac Enzymes: No results for input(s): CKTOTAL, CKMB, CKMBINDEX, TROPONINI in the last 168 hours.  BNP (last 3 results) No results for input(s): BNP in the last 8760 hours.  ProBNP (last 3 results) No results for input(s): PROBNP in the last 8760 hours.   Serum creatinine: 0.76 mg/dL 69/62/9503/18/18 [id number] Estimated creatinine clearance: 109.9 mL/min  CBG: No results for input(s): GLUCAP in the last 168 hours.  Radiological Exams on Admission: Dg Chest 2 View  Result Date: 09/12/2016 CLINICAL DATA:  Possible stroke. EXAM: CHEST  2 VIEW COMPARISON:  None. FINDINGS: The heart size and mediastinal contours are within normal limits. Both lungs are clear. The visualized skeletal structures are unremarkable. IMPRESSION: Normal examination. Electronically Signed   By: Beckie SaltsSteven  Reid M.D.   On: 09/12/2016 14:26   Ct Head Wo Contrast  Result Date: 09/12/2016 CLINICAL DATA:  Severe headache. Left-sided facial tingling. Left arm and left leg tingling. EXAM: CT HEAD WITHOUT CONTRAST TECHNIQUE: Contiguous axial images were obtained from the base of the skull through the vertex without intravenous contrast. COMPARISON:  Cervical MRI 03/06/2009 FINDINGS: Brain: Previous suboccipital craniectomy for treatment of Chiari malformation. No sign of old or acute infarction, mass lesion, hemorrhage,  hydrocephalus or extra-axial collection. Vascular: No abnormal vascular finding. Skull: Negative except for sub occipital craniectomy. Sinuses/Orbits: Negative except for retention cyst right maxillary sinus. Orbits negative. Other: None IMPRESSION: No acute finding. Previous suboccipital craniectomy for treatment of Chiari malformation. These results were called by telephone at the time of interpretation on 09/12/2016 at 10:21 am to Dr. Donnetta HutchingBRIAN COOK , who verbally acknowledged these results. Electronically Signed   By: Paulina FusiMark  Shogry M.D.   On: 09/12/2016 10:21   Mr Maxine GlennMra Head Wo Contrast  Result Date: 09/12/2016 CLINICAL DATA:  Severe headache with left facial tingling in tingling of the left arm and  leg. Previous Chiari surgery. EXAM: MRI HEAD WITHOUT CONTRAST MRA HEAD WITHOUT CONTRAST TECHNIQUE: Multiplanar, multiecho pulse sequences of the brain and surrounding structures were obtained without intravenous contrast. Angiographic images of the head were obtained using MRA technique without contrast. COMPARISON:  Earlier same day FINDINGS: MRI HEAD FINDINGS Brain: Diffusion imaging does not show any acute or subacute infarction. There has been previous cell occipital craniectomy for treatment of Chiari malformation. No focal abnormality is seen affecting the brainstem or cerebellum. Cerebral hemispheres are normal except for a few punctate foci of T2 and FLAIR signal in the frontal white matter, not likely significant. No cortical abnormality. No mass lesion, hemorrhage, hydrocephalus or extra-axial collection. Vascular: Major vessels at the base of the brain show flow. Skull and upper cervical spine: Negative other than the suboccipital craniectomy. Sinuses/Orbits: Insignificant retention cyst right maxillary sinus. Orbits negative. Other: None MRA HEAD FINDINGS Both internal carotid arteries are widely patent through the skullbase and siphon regions. The anterior and middle cerebral vessels are normal without  proximal stenosis, aneurysm or vascular malformation. Both vertebral arteries are widely patent to the basilar. No basilar stenosis. Posterior circulation branch vessels are normal. IMPRESSION: Previous suboccipital craniectomy for treatment of Chiari malformation. Otherwise unremarkable study. No evidence of acute infarction. Normal intracranial MR angiography. Few punctate white matter foci in the frontal lobes, not likely of any clinical significance. In a patient with history of headache, they could even be migraine related foci. Electronically Signed   By: Paulina Fusi M.D.   On: 09/12/2016 13:52   Mr Brain Wo Contrast  Result Date: 09/12/2016 CLINICAL DATA:  Severe headache with left facial tingling in tingling of the left arm and leg. Previous Chiari surgery. EXAM: MRI HEAD WITHOUT CONTRAST MRA HEAD WITHOUT CONTRAST TECHNIQUE: Multiplanar, multiecho pulse sequences of the brain and surrounding structures were obtained without intravenous contrast. Angiographic images of the head were obtained using MRA technique without contrast. COMPARISON:  Earlier same day FINDINGS: MRI HEAD FINDINGS Brain: Diffusion imaging does not show any acute or subacute infarction. There has been previous cell occipital craniectomy for treatment of Chiari malformation. No focal abnormality is seen affecting the brainstem or cerebellum. Cerebral hemispheres are normal except for a few punctate foci of T2 and FLAIR signal in the frontal white matter, not likely significant. No cortical abnormality. No mass lesion, hemorrhage, hydrocephalus or extra-axial collection. Vascular: Major vessels at the base of the brain show flow. Skull and upper cervical spine: Negative other than the suboccipital craniectomy. Sinuses/Orbits: Insignificant retention cyst right maxillary sinus. Orbits negative. Other: None MRA HEAD FINDINGS Both internal carotid arteries are widely patent through the skullbase and siphon regions. The anterior and middle  cerebral vessels are normal without proximal stenosis, aneurysm or vascular malformation. Both vertebral arteries are widely patent to the basilar. No basilar stenosis. Posterior circulation branch vessels are normal. IMPRESSION: Previous suboccipital craniectomy for treatment of Chiari malformation. Otherwise unremarkable study. No evidence of acute infarction. Normal intracranial MR angiography. Few punctate white matter foci in the frontal lobes, not likely of any clinical significance. In a patient with history of headache, they could even be migraine related foci. Electronically Signed   By: Paulina Fusi M.D.   On: 09/12/2016 13:52    EKG: No STEMI. NSR.  Assessment/Plan Principal Problem:   Weakness of left lower extremity Active Problems:   Essential hypertension, benign   Anxiety state   Left arm weakness  Weakness CT head negative for acute bleed MRI ordered, no stroke  seen Aspirin full dose given  And daily A1c ordered Lipid panel ordered Admitted to telemetry bed Echo ordered Carotid Dopplers ordered MRA ordered Stroke team to follow patient PT/OT  Hypertension When necessary hydralazine 10 mg IV as needed for severe blood pressure Start on new HTN med tomorrow  Anxiety Prn ativan  Code Status: FULL  DVT Prophylaxis: heparin Family Communication: son at bedside Disposition Plan: Pending Improvement  Status: tele inpt  Haydee Salter, MD Family Medicine [facility] [email or web address] Password [email or web address]

## 2016-09-12 NOTE — ED Notes (Signed)
CODE STOKE CALLED BY DR Adriana SimasOOK 09:39.

## 2016-09-12 NOTE — Consult Note (Signed)
Neurology Consultation Reason for Consult: code stroke at [facility] Referring Physician: Dr Adriana Simas  CC: left sided  Numbness and weakness  History is obtained from: patient  HPI: Jenna Byrd is a 46 y.o. female with a hx of HTN and obesity, not on antiplatelets.  She presented to [facility] ED earlier with left sided weakness.  I received a call from Dr Adriana Simas regarding tPA and based on the information he gave me on the phone I confirmed that such a case would merit treatment with ivTPA.  I was led to believe she was going to receive tPA at [facility] ED and I asked for pt to be sent to the ICU with me as the admitting attending.  Dr. Adriana Simas decided not to give tPA, but still sent the pt to the unit and was called when the pt arrived.  I requested a code stroke called thinking I was going to give tPA based on the story I received on the phone from Dr Adriana Simas but when I assessed the pt she tell me she does not know when her symptoms started and consequently I canceled the code stroke and requested the pt to be admitted to medicine.  She was therefore not a tPA candidate.  She tells me that her symptoms woke her up.  She had a headache that woke her from sleep and when she attempted to walk she noticed her leg was weak.  Thus the last time her leg was normal was last night.   ROS: A 14 point ROS was performed and is negative except as noted in the HPI.  Past Medical History:  Diagnosis Date  . Chiari malformation type I (HCC)   . Depression   . Endometriosis   . Hypertension     Family History  Problem Relation Age of Onset  . Diabetes Mother   . Stroke Mother   . Hypertension Mother   . Heart disease Father     MI  . Stroke Father   . Cancer Maternal Aunt     deceased - uterine  . Diabetes Maternal Grandfather   . Dementia Maternal Grandmother     Social History:  reports that she has never smoked. She has never used smokeless tobacco. She reports that she drinks alcohol. She reports that she does not use  drugs. Exam: Current vital signs: BP (!) 142/131   Pulse 75   Temp 97.9 F (36.6 C) (Oral)   Resp 16   Ht 5' 7.5" (1.715 m)   Wt 106.1 kg (234 lb)   SpO2 94%   BMI 36.11 kg/m  Vital signs in last 24 hours: Temp:  [97.9 F (36.6 C)-98.3 F (36.8 C)] 97.9 F (36.6 C) (03/18 1040) Pulse Rate:  [75-84] 75 (03/18 1100) Resp:  [11-19] 16 (03/18 1100) BP: (142-162)/(100-131) 142/131 (03/18 1100) SpO2:  [94 %-100 %] 94 % (03/18 1100) Weight:  [106.1 kg (234 lb)] 106.1 kg (234 lb) (03/18 0902)   Physical Exam  Constitutional: Appears well-developed and well-nourished.  Psych: Affect appropriate to situation Eyes: No scleral injection HENT: No OP obstrucion Head: Normocephalic.  Cardiovascular: Normal rate and regular rhythm.  Respiratory: Effort normal and breath sounds normal to anterior ascultation GI: Soft.  No distension. There is no tenderness.  Skin: WDI  Neuro: Mental Status: Patient is awake, alert, oriented to person, place, month, year, and situation. Patient is able to give a clear and coherent history No signs of aphasia or neglect Cranial Nerves: II: Visual Fields are  full. Pupils are equal, round, and reactive to light.  III,IV, VI: EOMI without ptosis or diploplia.  V: Facial sensation is symmetric to temperature VII: Facial movement is symmetric.  VIII: hearing is intact to voice X: Uvula elevates symmetrically XI: Shoulder shrug is symmetric. XII: tongue is midline without atrophy or fasciculations.  Motor: Tone is normal. Bulk is normal. Minor left leg drift (NIHSS =1); on confrontation she has 4+/5 weakness in both left arm and leg Sensory: Sensation is symmetric to light touch and temperature in the arms and legs. Deep Tendon Reflexes: 2+ and symmetric in the biceps and patellae.  Plantars: Toes are downgoing bilaterally. Cerebellar: FNF and HKS are intact bilaterally    I have reviewed labs in epic and the results pertinent to this  consultation are: head CT, labs  A/P: probably a small right sided lacunar stroke. Asa 325 now.  Pt is asa naive.  Permissive HTN.  Stroke w/u. lovenox for dvt prophylaxis.  Pt/ot/st.  Stroke to follow pt beginning tomorrow.

## 2016-09-12 NOTE — ED Notes (Signed)
[facility]. She remains awake, alert and oriented x 4 with clear speech.

## 2016-09-13 ENCOUNTER — Inpatient Hospital Stay (HOSPITAL_COMMUNITY): Payer: BC Managed Care – PPO

## 2016-09-13 DIAGNOSIS — I639 Cerebral infarction, unspecified: Secondary | ICD-10-CM

## 2016-09-13 DIAGNOSIS — R29898 Other symptoms and signs involving the musculoskeletal system: Secondary | ICD-10-CM | POA: Diagnosis not present

## 2016-09-13 DIAGNOSIS — I6789 Other cerebrovascular disease: Secondary | ICD-10-CM

## 2016-09-13 LAB — LIPID PANEL
CHOL/HDL RATIO: 3.5 ratio
Cholesterol: 139 mg/dL (ref 0–200)
HDL: 40 mg/dL — AB (ref >40)
LDL Cholesterol: 74 mg/dL (ref 0–99)
TRIGLYCERIDES: 126 mg/dL (ref <150)
VLDL: 25 mg/dL (ref 0–40)

## 2016-09-13 LAB — ECHOCARDIOGRAM COMPLETE
HEIGHTINCHES: 67.5 in
WEIGHTICAEL: 3744 [oz_av]

## 2016-09-13 LAB — HIV ANTIBODY (ROUTINE TESTING W REFLEX): HIV Screen 4th Generation wRfx: NONREACTIVE

## 2016-09-13 MED ORDER — ASPIRIN EC 81 MG PO TBEC: 81.0000 mg | DELAYED_RELEASE_TABLET | Freq: Every day | ORAL | 0 refills | Status: AC

## 2016-09-13 NOTE — Care Management Note (Signed)
Case Management Note  Patient Details  Name: Jenna Byrd MRN: [id number] Date of Birth: Nov 23, 1970  Subjective/Objective:            Patient presented with severe HA, left-sided numbness/weakness.  At baseline, independent with ADLs. CM will follow for discharge needs pending patient's progress and physician orders.         Action/Plan:   Expected Discharge Date:                  Expected Discharge Plan:     In-House Referral:     Discharge planning Services     Post Acute Care Choice:    Choice offered to:     DME Arranged:    DME Agency:     HH Arranged:    HH Agency:     Status of Service:     If discussed at [employer] Length of Stay Meetings, dates discussed:    Additional Comments:  Anda KraftRobarge, [name], RN 09/13/2016, 10:25 AM

## 2016-09-13 NOTE — Progress Notes (Signed)
STROKE TEAM PROGRESS NOTE   SUBJECTIVE (INTERVAL HISTORY) No family is at the bedside.  She states her headache has resolved and numbness still persists   OBJECTIVE Temp:  [97.8 F (36.6 C)-98.9 F (37.2 C)] 97.9 F (36.6 C) (03/19 0700) Pulse Rate:  [64-84] 67 (03/19 0700) Cardiac Rhythm: Normal sinus rhythm (03/18 1215) Resp:  [11-20] 18 (03/19 0700) BP: (142-168)/(84-131) 157/93 (03/19 0700) SpO2:  [94 %-100 %] 96 % (03/19 0700) Weight:  [106.1 kg (234 lb)] 106.1 kg (234 lb) (03/18 1328)  CBC:  Recent Labs Lab 09/12/16 0943 09/12/16 0953 09/12/16 1136  WBC 9.2  --  9.0  NEUTROABS 6.5  --   --   HGB 13.3 12.9 13.4  HCT 36.5 38.0 37.3  MCV 70.3*  --  72.9*  PLT 263  --  PLATELET CLUMPS NOTED ON SMEAR, COUNT APPEARS INCREASED    Basic Metabolic Panel:  Recent Labs Lab 09/12/16 0943 09/12/16 0953 09/12/16 1136  NA 139 142  --   K 4.1 3.7  --   CL 108 105  --   CO2 26  --   --   GLUCOSE 121* 120*  --   BUN 11 11  --   CREATININE 0.83 0.90 0.76  CALCIUM 9.1  --   --     HgbA1c: No results found for: HGBA1C Urine Drug Screen: No results found for: LABOPIA, COCAINSCRNUR, LABBENZ, AMPHETMU, THCU, LABBARB     PHYSICAL EXAM Obese middle-aged African American lady not in distress.. . Afebrile. Head is nontraumatic. Neck is supple without bruit.    Cardiac exam no murmur or gallop. Lungs are clear to auscultation. Distal pulses are well felt. ,Neurological Exam ;  Awake  Alert oriented x 3. Normal speech and language.eye movements full without nystagmus.fundi were not visualized. Vision acuity and fields appear normal. Hearing is normal. Palatal movements are normal. Face symmetric. Tongue midline. Normal strength, tone, reflexes and coordination. Subjective left hemibody diminished sensation to touch and pinprick. Gait deferred.  ASSESSMENT/PLAN Jenna Byrd is a 46 y.o. female with history of HTN and DB who woke with a HA and notes she had L leg  weakness. She did not receive IV t-PA due to delay in arrival.   R brain TIA versus complicated migraine  Resultant  HA resolved. Still with L sided numbness, weakness better  CT head no acute abnormality. Old suboccipital craniectomy for Chiari  MRI  No acute infarct. Old suboccipital craniectomy for Chiari.  MRA  Normal   Carotid Doppler  pending   2D Echo  pending   LDL 74  HgbA1c pending  Heparin 5000 units sq tid for VTE prophylaxis  Diet Heart Room service appropriate? Yes; Fluid consistency: Thin  No antithrombotic prior to admission, now on aspirin 325 mg daily  Therapy recommendations:  Ok to be OOB. OP OT. No PT.   Disposition:  Return home  Hypertension  Prescribed antihypertensives, but she has not been taking  Remains elevated  Diabetes  HgbA1c goal < 7.0  Other Stroke Risk Factors  Infrequent ETOH use  Obesity, Body mass index is 36.11 kg/m.  Family hx stroke (mother, father)  Hx migraines, resolved after Chiari repair  Other Active Problems  Chiari malformation type I  Depression / anxiety   Hormones listed on PTA meds, pt states she is not taking. Patient advised to avoid hormones if possible  Hospital day # 1  Jenna MoodyBIBY,Jenna Byrd  [facility] See Amion for Pager information 09/13/2016  1:57 PM  I have personally examined this patient, reviewed notes, independently viewed imaging studies, participated in medical decision making and plan of care.ROS completed by me personally and pertinent positives fully documented  I have made any additions or clarifications directly to the above note. Agree with note above. Recommend aspirin for stroke prevention  and statinand continue ongoing stroke evaluation.greater than 50% time during this 25 minute visit was spent on counseling and coordination of care and discussion of differential diagnosis of TIA versus complicated migraine and answering questions.discussed with Dr. Grier Rocher,  MD Medical Director [facility] Pager: [phone] 09/13/2016 8:37 PM  To contact Stroke Continuity provider, please refer to [email or web address]. After hours, contact General Neurology

## 2016-09-13 NOTE — Evaluation (Addendum)
Occupational Therapy Evaluation Patient Details Name: Jenna Byrd MRN: [id number] DOB: [birth year] Today's Date: 09/13/2016    History of Present Illness Pt is a 46 y.o. female who presented to the ED with L sided headache, L sided numbness and weakness. She has a PMH significant for Chiari malformation type I with resection, depression, endometriosis, and hypertension.   Clinical Impression   PTA, pt was independent with ADL and functional mobility. 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 Pt currently requires overall supervision for toilet transfers as she remains slightly unsteady with functional mobility. Pt additionally presents with decreased L UE sensation for light touch, decreased L UE strength and L hand fine motor coordination impacting ability to complete ADL and work tasks at [employer]. Initiated HEP to address this with handouts provided. She would benefit from outpatient OT services to improve functional use of L UE. OT will continue to follow acutely.    Follow Up Recommendations  Outpatient OT    Equipment Recommendations  None recommended by OT    Recommendations for Other Services       Precautions / Restrictions Precautions Precautions: Fall Precaution Comments: Numbness/weakness L LE Restrictions Weight Bearing Restrictions: No      Mobility Bed Mobility Overal bed mobility: Modified Independent             General bed mobility comments: Increased time  Transfers Overall transfer level: Needs assistance Equipment used: None Transfers: Sit to/from Stand Sit to Stand: Supervision         General transfer comment: Supervision for safety.    Balance Overall balance assessment: Needs assistance Sitting-balance support: No upper extremity supported;Feet supported Sitting balance-Leahy Scale: Good     Standing balance support: No upper extremity supported;Single extremity supported;During functional activity Standing balance-Leahy Scale:  Fair Standing balance comment: Prefers to push IV pole for stability.                            ADL Overall ADL's : Needs assistance/impaired Eating/Feeding: Set up;Sitting   Grooming: Set up;Sitting   Upper Body Bathing: Set up;Sitting   Lower Body Bathing: Set up;Sit to/from stand   Upper Body Dressing : Supervision/safety;Sitting Upper Body Dressing Details (indicate cue type and reason): Increased time with fasteners. Lower Body Dressing: Set up;Sit to/from stand   Toilet Transfer: Supervision/safety;Ambulation;Regular Teacher, adult education Details (indicate cue type and reason): Supervision for safety. Pt slightly unseteady and prefers to push IV pole. Toileting- Clothing Manipulation and Hygiene: Supervision/safety;Sit to/from stand       Functional mobility during ADLs: Supervision/safety General ADL Comments: Pt with decreased L UE strength and fine motor coordination limiting independence with ADL. Supervision for functional mobility for safety. Pt prefers to push IV pole and feels more steady with this.     Vision Baseline Vision/History: Wears glasses Wears Glasses: Reading only Patient Visual Report: No change from baseline Vision Assessment?: Yes Eye Alignment: Within Functional Limits Ocular Range of Motion: Within Functional Limits Alignment/Gaze Preference: Within Defined Limits Tracking/Visual Pursuits: Decreased smoothness of horizontal tracking (Nystagmus when crossing midline horizontally.) Saccades: Within functional limits Convergence: Within functional limits Visual Fields: No apparent deficits Additional Comments: Able to functionally use central and peripheral vision. She did demonstrate "jumping" with horizontal tracking when crossing midline. Able to read appropriately.     Perception     Praxis      Pertinent Vitals/Pain Pain Assessment: No/denies pain     Hand Dominance Right   Extremity/Trunk  Assessment Upper Extremity  Assessment Upper Extremity Assessment: LUE deficits/detail LUE Deficits / Details: Shoulder strength and AROM WFL. 4/5 strength for elbow flexion/extension and grasp. Decreased fine motor coordination. Gross motor coordination in tact. LUE Coordination: decreased fine motor   Lower Extremity Assessment Lower Extremity Assessment: Defer to PT evaluation       Communication Communication Communication: No difficulties   Cognition Arousal/Alertness: Awake/alert Behavior During Therapy: WFL for tasks assessed/performed Overall Cognitive Status: Within Functional Limits for tasks assessed                 General Comments: Able to problem solve well during OT evaluation.   General Comments       Exercises Exercises: Other exercises Other Exercises Other Exercises: Initiated HEP for fine motor coordination and therapy putty exercises. Pt able to complete all independently and demonstrates good understanding. Other Exercises: Educated pt on L UE bicep/tricep strengthening exercises with written instructions provided utilizing green therapy band. She demonstrates good understanding.   Shoulder Instructions      Home Living Family/patient expects to be discharged to:: Private residence Living Arrangements: Children Available Help at Discharge: Family;Available PRN/intermittently Type of Home: House Home Access: Stairs to enter Entergy CorporationEntrance Stairs-Number of Steps: 6   Home Layout: Multi-level (Split level) Alternate Level Stairs-Number of Steps: 8   Bathroom Shower/Tub: Producer, television/film/videoWalk-in shower   Bathroom Toilet: Standard     Home Equipment: Grab bars - tub/shower;Hand held shower head          Prior Functioning/Environment Level of Independence: Independent        Comments: Works as a Runner, broadcasting/film/videoteacher.        OT Problem List: Decreased strength;Decreased activity tolerance;Impaired balance (sitting and/or standing);Decreased coordination;Impaired UE functional use      OT  Treatment/Interventions: Self-care/ADL training;Therapeutic exercise;Therapeutic activities;Patient/family education;Balance training;Neuromuscular education;Energy conservation;DME and/or AE instruction;Cognitive remediation/compensation    OT Goals(Current goals can be found in the care plan section) Acute Rehab OT Goals Patient Stated Goal: to go home OT Goal Formulation: With patient Time For Goal Achievement: 09/27/16 Potential to Achieve Goals: Good ADL Goals Pt/caregiver will Perform Home Exercise Program: Left upper extremity;With written HEP provided;Independently;Increased strength;With theraband (increased strength and fine motor coordination L UE) Additional ADL Goal #1: Pt will demonstrate improved fine motor coordination to complete a variety of fasteners during dressing tasks independently.  OT Frequency: Min 2X/week   Barriers to D/C:            Co-evaluation              End of Session Nurse Communication: Mobility status  Activity Tolerance: Patient tolerated treatment well Patient left: in bed;with call bell/phone within reach  OT Visit Diagnosis: Hemiplegia and hemiparesis Hemiplegia - Right/Left: Left Hemiplegia - dominant/non-dominant: Non-Dominant Hemiplegia - caused by: Unspecified                ADL either performed or assessed with clinical judgement  Time: 1016-1035 OT Time Calculation (min): 19 min Charges:  OT General Charges $OT Visit: 1 Procedure OT Evaluation $OT Eval Low Complexity: 1 Procedure G-Codes:     Jenna Sectionharity A Coretha Creswell, Jenna Byrd  Pager: [phone]   Jenna Byrd 09/13/2016, 12:01 PM

## 2016-09-13 NOTE — Evaluation (Signed)
Physical Therapy Evaluation Patient Details Name: Jenna Byrd MRN: [id number] DOB: 22-Apr-1971 Today's Date: 09/13/2016   History of Present Illness  Pt is a 46 y.o. female who presented to the ED with L sided headache, L sided numbness and weakness. She has a PMH significant for Chiari malformation type I with resection, depression, endometriosis, and hypertension.  Clinical Impression  Pt presents with decreased strength, balance and coordination secondary to above. Recommend d/c home secondary to pt functioning near baseline. Pt would benefit from acute PT services to address balance and strength for safe transition home. Acute PT will follow.     Follow Up Recommendations No PT follow up;Supervision for mobility/OOB    Equipment Recommendations  None recommended by PT    Recommendations for Other Services       Precautions / Restrictions Precautions Precautions: Fall Precaution Comments: Numbness/weakness L LE Restrictions Weight Bearing Restrictions: No      Mobility  Bed Mobility Overal bed mobility: Modified Independent             General bed mobility comments: increased time  Transfers Overall transfer level: Modified independent Equipment used: None Transfers: Sit to/from Stand Sit to Stand: Modified independent (Device/Increase time)         General transfer comment: increased time  Ambulation/Gait Ambulation/Gait assistance: Min guard Ambulation Distance (Feet): 180 Feet Assistive device: None Gait Pattern/deviations: Step-through pattern;Decreased stride length Gait velocity: decreased Gait velocity interpretation: Below normal speed for age/gender General Gait Details: slow speed. pt reports feeling unsure of L LE and states she feels as though it is going to give out on her. pt ocassionally reaches for rails in hallway due to decreased confidence  Stairs Stairs: Yes Stairs assistance: Min assist;Min guard Stair Management: One rail  Right;Step to pattern;Forwards Number of Stairs: 5 General stair comments: minA for cues for technique, progressed to min guard once understood technique  Wheelchair Mobility    Modified Rankin (Stroke Patients Only) Modified Rankin (Stroke Patients Only) Pre-Morbid Rankin Score: No symptoms Modified Rankin: No significant disability     Balance Overall balance assessment: Needs assistance Sitting-balance support: Feet supported;No upper extremity supported Sitting balance-Leahy Scale: Good Sitting balance - Comments: sat EOB x 5 min without LOB   Standing balance support: No upper extremity supported Standing balance-Leahy Scale: Fair Standing balance comment: stood for adjustment of gown x 2 min without LOB                             Pertinent Vitals/Pain Pain Assessment: No/denies pain    Home Living Family/patient expects to be discharged to:: Private residence Living Arrangements: Children Available Help at Discharge: Family;Available 24 hours/day Type of Home: House Home Access: Stairs to enter Entrance Stairs-Rails: Right Entrance Stairs-Number of Steps: 6 Home Layout: Multi-level (split level) Home Equipment: Grab bars - tub/shower;Hand held shower head Additional Comments: son is 76 years old and can provide 24 hr assist    Prior Function Level of Independence: Independent         Comments: works as Runner, broadcasting/film/video at [employer]   Dominant Hand: Right    Extremity/Trunk Assessment   Upper Extremity Assessment Upper Extremity Assessment: LUE deficits/detail LUE Deficits / Details: Shoulder strength and AROM WFL. 4/5 strength for elbow flexion/extension and grasp. Gross motor coordination in tact. LUE Sensation: decreased light touch LUE Coordination: decreased fine motor    Lower Extremity Assessment Lower Extremity Assessment: LLE  deficits/detail LLE Deficits / Details: L LE strength deficits with 4/5 MMT with hip flexion and  knee extension. impaired gross motor coordination LLE Sensation: decreased light touch LLE Coordination: decreased gross motor    Cervical / Trunk Assessment Cervical / Trunk Assessment: Normal  Communication   Communication: No difficulties  Cognition Arousal/Alertness: Awake/alert Behavior During Therapy: WFL for tasks assessed/performed Overall Cognitive Status: Within Functional Limits for tasks assessed                 General Comments: Able to problem solve well during OT evaluation.    General Comments      Exercises General Exercises - Lower Extremity Long Arc Quad: AROM;Strengthening;Left;10 reps;Seated Hip Flexion/Marching: AROM;Strengthening;Left;10 reps;Seated Other Exercises Other Exercises: Initiated HEP for fine motor coordination and therapy putty exercises. Pt able to complete all independently and demonstrates good understanding. Other Exercises: Educated pt on L UE bicep/tricep strengthening exercises with written instructions provided utilizing green therapy band. She demonstrates good understanding.   Assessment/Plan    PT Assessment Patient needs continued PT services  PT Problem List Decreased strength;Decreased activity tolerance;Decreased balance;Decreased mobility;Decreased coordination       PT Treatment Interventions Gait training;Stair training;Functional mobility training;Therapeutic activities;Therapeutic exercise;Balance training;Neuromuscular re-education;Patient/family education    PT Goals (Current goals can be found in the Care Plan section)  Acute Rehab PT Goals Patient Stated Goal: to go home PT Goal Formulation: With patient Time For Goal Achievement: 09/27/16 Potential to Achieve Goals: Good    Frequency Min 3X/week   Barriers to discharge        Co-evaluation               End of Session Equipment Utilized During Treatment: Gait belt Activity Tolerance: Patient tolerated treatment well Patient left: in  chair;with call bell/phone within reach;with nursing/sitter in room Nurse Communication: Mobility status PT Visit Diagnosis: Unsteadiness on feet (R26.81);Muscle weakness (generalized) (M62.81);Difficulty in walking, not elsewhere classified (R26.2)         Time: 4540-98111156-1212 PT Time Calculation (min) (ACUTE ONLY): 16 min   Charges:   PT Evaluation $PT Eval Moderate Complexity: 1 Procedure     PT G CodesLane Hacker:         Thaddeaus Monica 09/13/2016, 1:26 PM   Lane HackerErika Sloan Galentine, SPT Acute Rehab SPT [phone]

## 2016-09-13 NOTE — Progress Notes (Signed)
  Echocardiogram 2D Echocardiogram has been performed.  Jenna Byrd 09/13/2016, 3:00 PM

## 2016-09-13 NOTE — Discharge Summary (Signed)
Physician Discharge Summary  Jenna Byrd:[id number] DOB: 09-16-1970 DOA: 09/12/2016  PCP: No PCP Per Patient  Admit date: 09/12/2016 Discharge date: 09/13/2016  Time spent: > 35 minutes  Recommendations for Outpatient Follow-up:  1.    Discharge Diagnoses:  Principal Problem:   Weakness of left lower extremity Active Problems:   Essential hypertension, benign   Anxiety state   Left arm weakness   Discharge Condition: stable  Diet recommendation: heart healthy  Filed Weights   09/12/16 0902  Weight: 106.1 kg (234 lb)    History of present illness:   46 y.o. female  with past medical history of Chiari malformation type I with resection, depression, endometriosis, hypertension who presented to the emergency room with chief complaint of left-sided numbness and weakness at [facility]. Patient states she went to bed at around 3 AM and woke up at around 7:30a with these symptoms. Patient has a family history of stroke. So she took herself immediately to the emergency room to be evaluated.  Hospital Course:  Left sided numbness (stroke like Symptom) - Discussed with neurologist stroke/TIA workup negative. He suspects migraine versus TIA. Will discharge on aspirin. To follow-up with primary care physician or neurologist for further evaluation recommendations  For other known medical problems listed above will continue home medication list below  Procedures:  None  Consultations:  Neurology  Discharge Exam: Vitals:   09/13/16 0940 09/13/16 1500  BP: 133/71 (!) 156/94  Pulse: 85 68  Resp: 18 18  Temp: 98.4 F (36.9 C) 98.2 F (36.8 C)    General: Pt in nad, alert and awake Cardiovascular: rrr, no rubs Respiratory: no increased wob, no wheezes  Discharge Instructions   Discharge Instructions    Call MD for:  severe uncontrolled pain    Complete by:  As directed    Call MD for:  temperature >100.4    Complete by:  As directed    Diet - low  sodium heart healthy    Complete by:  As directed    Discharge instructions    Complete by:  As directed    f/u with your neurologist in 2-3 weeks or sooner should any new concerns arise.   Increase activity slowly    Complete by:  As directed      Current Discharge Medication List    START taking these medications   Details  aspirin EC 81 MG tablet Take 1 tablet (81 mg total) by mouth daily. Qty: 30 tablet, Refills: 0      CONTINUE these medications which have NOT CHANGED   Details  citalopram (CELEXA) 20 MG tablet Take 1 tablet (20 mg total) by mouth daily. Qty: 90 tablet, Refills: 3   Associated Diagnoses: Anxiety state, unspecified    lisinopril-hydrochlorothiazide (PRINZIDE,ZESTORETIC) 10-12.5 MG per tablet Take 1 tablet by mouth daily. Qty: 30 tablet, Refills: 3   Associated Diagnoses: Essential hypertension, benign    norethindrone-ethinyl estradiol (FEMHRT 1/5) 1-5 MG-MCG TABS Take 1 tablet by mouth daily. Qty: 28 tablet, Refills: 12   Associated Diagnoses: Endometriosis       Allergies  Allergen Reactions  . Shellfish Allergy Anaphylaxis  . Ivp Dye [Iodinated Diagnostic Agents]   . Adhesive [Tape] Rash      The results of significant diagnostics from this hospitalization (including imaging, microbiology, ancillary and laboratory) are listed below for reference.    Significant Diagnostic Studies: Dg Chest 2 View  Result Date: 09/12/2016 CLINICAL DATA:  Possible stroke. EXAM: CHEST  2  VIEW COMPARISON:  None. FINDINGS: The heart size and mediastinal contours are within normal limits. Both lungs are clear. The visualized skeletal structures are unremarkable. IMPRESSION: Normal examination. Electronically Signed   By: Beckie SaltsSteven  Reid M.D.   On: 09/12/2016 14:26   Ct Head Wo Contrast  Result Date: 09/12/2016 CLINICAL DATA:  Severe headache. Left-sided facial tingling. Left arm and left leg tingling. EXAM: CT HEAD WITHOUT CONTRAST TECHNIQUE: Contiguous axial images  were obtained from the base of the skull through the vertex without intravenous contrast. COMPARISON:  Cervical MRI 03/06/2009 FINDINGS: Brain: Previous suboccipital craniectomy for treatment of Chiari malformation. No sign of old or acute infarction, mass lesion, hemorrhage, hydrocephalus or extra-axial collection. Vascular: No abnormal vascular finding. Skull: Negative except for sub occipital craniectomy. Sinuses/Orbits: Negative except for retention cyst right maxillary sinus. Orbits negative. Other: None IMPRESSION: No acute finding. Previous suboccipital craniectomy for treatment of Chiari malformation. These results were called by telephone at the time of interpretation on 09/12/2016 at 10:21 am to Dr. Donnetta HutchingBRIAN COOK , who verbally acknowledged these results. Electronically Signed   By: Paulina FusiMark  Shogry M.D.   On: 09/12/2016 10:21   Mr Maxine GlennMra Head Wo Contrast  Result Date: 09/12/2016 CLINICAL DATA:  Severe headache with left facial tingling in tingling of the left arm and leg. Previous Chiari surgery. EXAM: MRI HEAD WITHOUT CONTRAST MRA HEAD WITHOUT CONTRAST TECHNIQUE: Multiplanar, multiecho pulse sequences of the brain and surrounding structures were obtained without intravenous contrast. Angiographic images of the head were obtained using MRA technique without contrast. COMPARISON:  Earlier same day FINDINGS: MRI HEAD FINDINGS Brain: Diffusion imaging does not show any acute or subacute infarction. There has been previous cell occipital craniectomy for treatment of Chiari malformation. No focal abnormality is seen affecting the brainstem or cerebellum. Cerebral hemispheres are normal except for a few punctate foci of T2 and FLAIR signal in the frontal white matter, not likely significant. No cortical abnormality. No mass lesion, hemorrhage, hydrocephalus or extra-axial collection. Vascular: Major vessels at the base of the brain show flow. Skull and upper cervical spine: Negative other than the suboccipital  craniectomy. Sinuses/Orbits: Insignificant retention cyst right maxillary sinus. Orbits negative. Other: None MRA HEAD FINDINGS Both internal carotid arteries are widely patent through the skullbase and siphon regions. The anterior and middle cerebral vessels are normal without proximal stenosis, aneurysm or vascular malformation. Both vertebral arteries are widely patent to the basilar. No basilar stenosis. Posterior circulation branch vessels are normal. IMPRESSION: Previous suboccipital craniectomy for treatment of Chiari malformation. Otherwise unremarkable study. No evidence of acute infarction. Normal intracranial MR angiography. Few punctate white matter foci in the frontal lobes, not likely of any clinical significance. In a patient with history of headache, they could even be migraine related foci. Electronically Signed   By: Paulina FusiMark  Shogry M.D.   On: 09/12/2016 13:52   Mr Brain Wo Contrast  Result Date: 09/12/2016 CLINICAL DATA:  Severe headache with left facial tingling in tingling of the left arm and leg. Previous Chiari surgery. EXAM: MRI HEAD WITHOUT CONTRAST MRA HEAD WITHOUT CONTRAST TECHNIQUE: Multiplanar, multiecho pulse sequences of the brain and surrounding structures were obtained without intravenous contrast. Angiographic images of the head were obtained using MRA technique without contrast. COMPARISON:  Earlier same day FINDINGS: MRI HEAD FINDINGS Brain: Diffusion imaging does not show any acute or subacute infarction. There has been previous cell occipital craniectomy for treatment of Chiari malformation. No focal abnormality is seen affecting the brainstem or cerebellum. Cerebral hemispheres are normal  except for a few punctate foci of T2 and FLAIR signal in the frontal white matter, not likely significant. No cortical abnormality. No mass lesion, hemorrhage, hydrocephalus or extra-axial collection. Vascular: Major vessels at the base of the brain show flow. Skull and upper cervical spine:  Negative other than the suboccipital craniectomy. Sinuses/Orbits: Insignificant retention cyst right maxillary sinus. Orbits negative. Other: None MRA HEAD FINDINGS Both internal carotid arteries are widely patent through the skullbase and siphon regions. The anterior and middle cerebral vessels are normal without proximal stenosis, aneurysm or vascular malformation. Both vertebral arteries are widely patent to the basilar. No basilar stenosis. Posterior circulation branch vessels are normal. IMPRESSION: Previous suboccipital craniectomy for treatment of Chiari malformation. Otherwise unremarkable study. No evidence of acute infarction. Normal intracranial MR angiography. Few punctate white matter foci in the frontal lobes, not likely of any clinical significance. In a patient with history of headache, they could even be migraine related foci. Electronically Signed   By: Paulina Fusi M.D.   On: 09/12/2016 13:52    Microbiology: No results found for this or any previous visit (from the past 240 hour(s)).   Labs: Basic Metabolic Panel:  Recent Labs Lab 09/12/16 0943 09/12/16 0953 09/12/16 1136  NA 139 142  --   K 4.1 3.7  --   CL 108 105  --   CO2 26  --   --   GLUCOSE 121* 120*  --   BUN 11 11  --   CREATININE 0.83 0.90 0.76  CALCIUM 9.1  --   --    Liver Function Tests:  Recent Labs Lab 09/12/16 0943  AST 31  ALT 35  ALKPHOS 65  BILITOT 0.9  PROT 7.6  ALBUMIN 4.2   No results for input(s): LIPASE, AMYLASE in the last 168 hours. No results for input(s): AMMONIA in the last 168 hours. CBC:  Recent Labs Lab 09/12/16 0943 09/12/16 0953 09/12/16 1136  WBC 9.2  --  9.0  NEUTROABS 6.5  --   --   HGB 13.3 12.9 13.4  HCT 36.5 38.0 37.3  MCV 70.3*  --  72.9*  PLT 263  --  PLATELET CLUMPS NOTED ON SMEAR, COUNT APPEARS INCREASED   Cardiac Enzymes: No results for input(s): CKTOTAL, CKMB, CKMBINDEX, TROPONINI in the last 168 hours. BNP: BNP (last 3 results) No results for  input(s): BNP in the last 8760 hours.  ProBNP (last 3 results) No results for input(s): PROBNP in the last 8760 hours.  CBG: No results for input(s): GLUCAP in the last 168 hours.  Signed:  Penny Pia MD.  [facility] 09/13/2016, 4:25 PM

## 2016-09-13 NOTE — Progress Notes (Signed)
RN discussed discharge instructions with patient including f/u appt, asa 81, s/sx of stroke, heart healthy diet. Patient is getting dressed. Patient to notify staff when transportation arrives.

## 2016-09-13 NOTE — Care Management Note (Signed)
Case Management Note  Patient Details  Name: Jenna Byrd MRN: [id number] Date of Birth: 1971/05/22  Subjective/Objective:                    Action/Plan: Pt discharging home with self care. OT recommending outpatient therapy. Currently patient refusing outpatient therapy, she states she can do the things needed at home. CM offered patient [employer] number to assist her in finding a PCP. Pt states she has gone to [facility] in the past and she will call and make an appointment with the same group. Pt has transportation home.   Expected Discharge Date:  09/13/16               Expected Discharge Plan:  Home/Self Care  In-House Referral:     Discharge planning Services  CM Consult  Post Acute Care Choice:    Choice offered to:     DME Arranged:    DME Agency:     HH Arranged:    HH Agency:     Status of Service:  Completed, signed off  If discussed at [employer] Length of Stay Meetings, dates discussed:    Additional Comments:  Kermit BaloKelli F Delmi Fulfer, RN 09/13/2016, 7:15 PM

## 2016-09-13 NOTE — Progress Notes (Signed)
Patient called, stated she was not given lisinopril hctz medication. Dr. Cena BentonVega notified. Dr. Cena BentonVega states patient will follow up with pcp.

## 2016-09-13 NOTE — Progress Notes (Signed)
*  PRELIMINARY RESULTS* Vascular Ultrasound Carotid Duplex (Doppler) has been completed.  Preliminary findings: Right 1-39% ICA stenosis, Left 40-59% stenosis distal ICA, antegrade vertebral flow bilaterally.   Jenna FischerCharlotte C Doratha Mcswain 09/13/2016, 2:55 PM

## 2016-09-13 NOTE — Progress Notes (Signed)
PROGRESS NOTE    Jenna Byrd  MWU:[id number] DOB: February 21, 1971 DOA: 09/12/2016 PCP: No PCP Per Patient   Brief Narrative:  46 y.o. female  with past medical history of Chiari malformation type I with resection, depression, endometriosis, hypertension who presented to the emergency room with chief complaint of left-sided numbness and weakness at [facility]. Patient states she went to bed at around 3 AM and woke up at around 7:30a with these symptoms. Patient has a family history of stroke.   Assessment & Plan:   Principal Problem:  Right brain TIA - stroke work up underway - Neurology on board.  Active Problems:   Essential hypertension, benign - allow permissive hypertension    Anxiety state - ativan prn   DVT prophylaxis: Heparin Code Status: Full Family Communication: d/c patient directly Disposition Plan: pending further work up   Consultants:   Neurology   Procedures: stroke work up   Antimicrobials: None   Subjective: Pt has no new complaints.   Objective: Vitals:   09/13/16 0500 09/13/16 0700 09/13/16 0940 09/13/16 1500  BP: (!) 143/84 (!) 157/93 133/71 (!) 156/94  Pulse: 64 67 85 68  Resp: 18 18 18 18   Temp: 98 F (36.7 C) 97.9 F (36.6 C) 98.4 F (36.9 C) 98.2 F (36.8 C)  TempSrc: Oral Oral Oral Oral  SpO2: 98% 96%  100%  Weight:      Height:        Intake/Output Summary (Last 24 hours) at 09/13/16 1542 Last data filed at 09/13/16 0900  Gross per 24 hour  Intake              240 ml  Output                0 ml  Net              240 ml   Filed Weights   09/12/16 0902  Weight: 106.1 kg (234 lb)    Examination:  General exam: Appears calm and comfortable, in nad. Respiratory system: Clear to auscultation. Respiratory effort normal. Cardiovascular system: S1 & S2 heard, RRR. No JVD, murmurs, rubs, gallops or clicks. No pedal edema. Gastrointestinal system: Abdomen is nondistended, soft and nontender. No organomegaly  or masses felt. Normal bowel sounds heard. Central nervous system: Alert and oriented. No facial asymmetry Extremities: Symmetric 5 x 5 power. Skin: No rashes, lesions or ulcers Psychiatry: Mood & affect appropriate.     Data Reviewed: I have personally reviewed following labs and imaging studies  CBC:  Recent Labs Lab 09/12/16 0943 09/12/16 0953 09/12/16 1136  WBC 9.2  --  9.0  NEUTROABS 6.5  --   --   HGB 13.3 12.9 13.4  HCT 36.5 38.0 37.3  MCV 70.3*  --  72.9*  PLT 263  --  PLATELET CLUMPS NOTED ON SMEAR, COUNT APPEARS INCREASED   Basic Metabolic Panel:  Recent Labs Lab 09/12/16 0943 09/12/16 0953 09/12/16 1136  NA 139 142  --   K 4.1 3.7  --   CL 108 105  --   CO2 26  --   --   GLUCOSE 121* 120*  --   BUN 11 11  --   CREATININE 0.83 0.90 0.76  CALCIUM 9.1  --   --    GFR: Estimated Creatinine Clearance: 109.9 mL/min (by C-G formula based on SCr of 0.76 mg/dL). Liver Function Tests:  Recent Labs Lab 09/12/16 0943  AST 31  ALT 35  ALKPHOS  65  BILITOT 0.9  PROT 7.6  ALBUMIN 4.2   No results for input(s): LIPASE, AMYLASE in the last 168 hours. No results for input(s): AMMONIA in the last 168 hours. Coagulation Profile:  Recent Labs Lab 09/12/16 0943  INR 0.83   Cardiac Enzymes: No results for input(s): CKTOTAL, CKMB, CKMBINDEX, TROPONINI in the last 168 hours. BNP (last 3 results) No results for input(s): PROBNP in the last 8760 hours. HbA1C: No results for input(s): HGBA1C in the last 72 hours. CBG: No results for input(s): GLUCAP in the last 168 hours. Lipid Profile:  Recent Labs  09/13/16 0344  CHOL 139  HDL 40*  LDLCALC 74  TRIG 161  CHOLHDL 3.5   Thyroid Function Tests: No results for input(s): TSH, T4TOTAL, FREET4, T3FREE, THYROIDAB in the last 72 hours. Anemia Panel: No results for input(s): VITAMINB12, FOLATE, FERRITIN, TIBC, IRON, RETICCTPCT in the last 72 hours. Sepsis Labs: No results for input(s): PROCALCITON,  LATICACIDVEN in the last 168 hours.  No results found for this or any previous visit (from the past 240 hour(s)).    Radiology Studies: Dg Chest 2 View  Result Date: 09/12/2016 CLINICAL DATA:  Possible stroke. EXAM: CHEST  2 VIEW COMPARISON:  None. FINDINGS: The heart size and mediastinal contours are within normal limits. Both lungs are clear. The visualized skeletal structures are unremarkable. IMPRESSION: Normal examination. Electronically Signed   By: Beckie Salts M.D.   On: 09/12/2016 14:26   Ct Head Wo Contrast  Result Date: 09/12/2016 CLINICAL DATA:  Severe headache. Left-sided facial tingling. Left arm and left leg tingling. EXAM: CT HEAD WITHOUT CONTRAST TECHNIQUE: Contiguous axial images were obtained from the base of the skull through the vertex without intravenous contrast. COMPARISON:  Cervical MRI 03/06/2009 FINDINGS: Brain: Previous suboccipital craniectomy for treatment of Chiari malformation. No sign of old or acute infarction, mass lesion, hemorrhage, hydrocephalus or extra-axial collection. Vascular: No abnormal vascular finding. Skull: Negative except for sub occipital craniectomy. Sinuses/Orbits: Negative except for retention cyst right maxillary sinus. Orbits negative. Other: None IMPRESSION: No acute finding. Previous suboccipital craniectomy for treatment of Chiari malformation. These results were called by telephone at the time of interpretation on 09/12/2016 at 10:21 am to Dr. Donnetta Hutching , who verbally acknowledged these results. Electronically Signed   By: Paulina Fusi M.D.   On: 09/12/2016 10:21   Mr Maxine Glenn Head Wo Contrast  Result Date: 09/12/2016 CLINICAL DATA:  Severe headache with left facial tingling in tingling of the left arm and leg. Previous Chiari surgery. EXAM: MRI HEAD WITHOUT CONTRAST MRA HEAD WITHOUT CONTRAST TECHNIQUE: Multiplanar, multiecho pulse sequences of the brain and surrounding structures were obtained without intravenous contrast. Angiographic images of  the head were obtained using MRA technique without contrast. COMPARISON:  Earlier same day FINDINGS: MRI HEAD FINDINGS Brain: Diffusion imaging does not show any acute or subacute infarction. There has been previous cell occipital craniectomy for treatment of Chiari malformation. No focal abnormality is seen affecting the brainstem or cerebellum. Cerebral hemispheres are normal except for a few punctate foci of T2 and FLAIR signal in the frontal white matter, not likely significant. No cortical abnormality. No mass lesion, hemorrhage, hydrocephalus or extra-axial collection. Vascular: Major vessels at the base of the brain show flow. Skull and upper cervical spine: Negative other than the suboccipital craniectomy. Sinuses/Orbits: Insignificant retention cyst right maxillary sinus. Orbits negative. Other: None MRA HEAD FINDINGS Both internal carotid arteries are widely patent through the skullbase and siphon regions. The anterior and middle  cerebral vessels are normal without proximal stenosis, aneurysm or vascular malformation. Both vertebral arteries are widely patent to the basilar. No basilar stenosis. Posterior circulation branch vessels are normal. IMPRESSION: Previous suboccipital craniectomy for treatment of Chiari malformation. Otherwise unremarkable study. No evidence of acute infarction. Normal intracranial MR angiography. Few punctate white matter foci in the frontal lobes, not likely of any clinical significance. In a patient with history of headache, they could even be migraine related foci. Electronically Signed   By: Paulina Fusi M.D.   On: 09/12/2016 13:52   Mr Brain Wo Contrast  Result Date: 09/12/2016 CLINICAL DATA:  Severe headache with left facial tingling in tingling of the left arm and leg. Previous Chiari surgery. EXAM: MRI HEAD WITHOUT CONTRAST MRA HEAD WITHOUT CONTRAST TECHNIQUE: Multiplanar, multiecho pulse sequences of the brain and surrounding structures were obtained without  intravenous contrast. Angiographic images of the head were obtained using MRA technique without contrast. COMPARISON:  Earlier same day FINDINGS: MRI HEAD FINDINGS Brain: Diffusion imaging does not show any acute or subacute infarction. There has been previous cell occipital craniectomy for treatment of Chiari malformation. No focal abnormality is seen affecting the brainstem or cerebellum. Cerebral hemispheres are normal except for a few punctate foci of T2 and FLAIR signal in the frontal white matter, not likely significant. No cortical abnormality. No mass lesion, hemorrhage, hydrocephalus or extra-axial collection. Vascular: Major vessels at the base of the brain show flow. Skull and upper cervical spine: Negative other than the suboccipital craniectomy. Sinuses/Orbits: Insignificant retention cyst right maxillary sinus. Orbits negative. Other: None MRA HEAD FINDINGS Both internal carotid arteries are widely patent through the skullbase and siphon regions. The anterior and middle cerebral vessels are normal without proximal stenosis, aneurysm or vascular malformation. Both vertebral arteries are widely patent to the basilar. No basilar stenosis. Posterior circulation branch vessels are normal. IMPRESSION: Previous suboccipital craniectomy for treatment of Chiari malformation. Otherwise unremarkable study. No evidence of acute infarction. Normal intracranial MR angiography. Few punctate white matter foci in the frontal lobes, not likely of any clinical significance. In a patient with history of headache, they could even be migraine related foci. Electronically Signed   By: Paulina Fusi M.D.   On: 09/12/2016 13:52    Scheduled Meds: . heparin  5,000 Units Subcutaneous Q8H   Continuous Infusions:   LOS: 1 day    Time spent: > 35 minutes  Penny Pia, MD [facility] Pager [phone]  If 7PM-7AM, please contact night-coverage [email or web address] Password [facility] 09/13/2016, 3:42 PM

## 2016-09-13 NOTE — Progress Notes (Signed)
Patient states she was told to restart blood pressure medication, states she was told by two doctors that she would leave with rx for a blood pressure medication. Dr. Raymond Gurney notified, states to encourage low sodium diet, exercise, and follow up with primary care physician She states she has no primary care physician to follow up with. Left message for on call case manager and Harvin Hazel, CM.  RN attempted to notify patient, patient did not answer phone and mail box is full. Will attempt to call patient again.

## 2016-09-13 NOTE — Progress Notes (Signed)
SLP Cancellation Note  Patient Details Name: Jenna Byrd MRN: [id number] DOB: 09/25/1970   Cancelled treatment:       Reason Eval/Treat Not Completed: SLP screened, no needs identified, will sign off   Blenda MountsCouture, Jenna Byrd 09/13/2016, 3:37 PM

## 2016-09-14 LAB — VAS US CAROTID
LCCADDIAS: -41 cm/s
LCCADSYS: -96 cm/s
LEFT ECA DIAS: -21 cm/s
LEFT VERTEBRAL DIAS: -15 cm/s
LICADSYS: -114 cm/s
LICAPSYS: -85 cm/s
Left CCA prox dias: 25 cm/s
Left CCA prox sys: 90 cm/s
Left ICA dist dias: -48 cm/s
Left ICA prox dias: -35 cm/s
RCCAPSYS: 81 cm/s
RIGHT ECA DIAS: -22 cm/s
RIGHT VERTEBRAL DIAS: -19 cm/s
Right CCA prox dias: 21 cm/s
Right cca dist sys: -99 cm/s

## 2016-09-14 LAB — HEMOGLOBIN A1C
HEMOGLOBIN A1C: 5.6 % (ref 4.8–5.6)
Mean Plasma Glucose: 114 mg/dL

## 2016-09-14 NOTE — Progress Notes (Signed)
09/14/2016 at 1200 pm: received message last night from bedside RN that patient now requesting assistance finding a PCP. CM called the number listed but no response and unable to leave a message d/t voicemail being full. CM will attempt again later today.

## 2017-08-29 ENCOUNTER — Other Ambulatory Visit: Payer: Self-pay | Admitting: Physician Assistant

## 2017-08-29 DIAGNOSIS — R748 Abnormal levels of other serum enzymes: Secondary | ICD-10-CM

## 2017-11-17 ENCOUNTER — Ambulatory Visit
Admission: RE | Admit: 2017-11-17 | Discharge: 2017-11-17 | Disposition: A | Discharge status: Home / Self Care | Payer: BC Managed Care – PPO | Source: Ambulatory Visit | Attending: Physician Assistant | Admitting: Physician Assistant

## 2017-11-17 DIAGNOSIS — R748 Abnormal levels of other serum enzymes: Secondary | ICD-10-CM

## 2018-02-15 IMAGING — DX DG CHEST 2V
2 series · 2 of 2 positions shown · non-contrast
Comparison: None.

CLINICAL DATA: Possible stroke.

EXAM:
CHEST  2 VIEW

[chest pa]
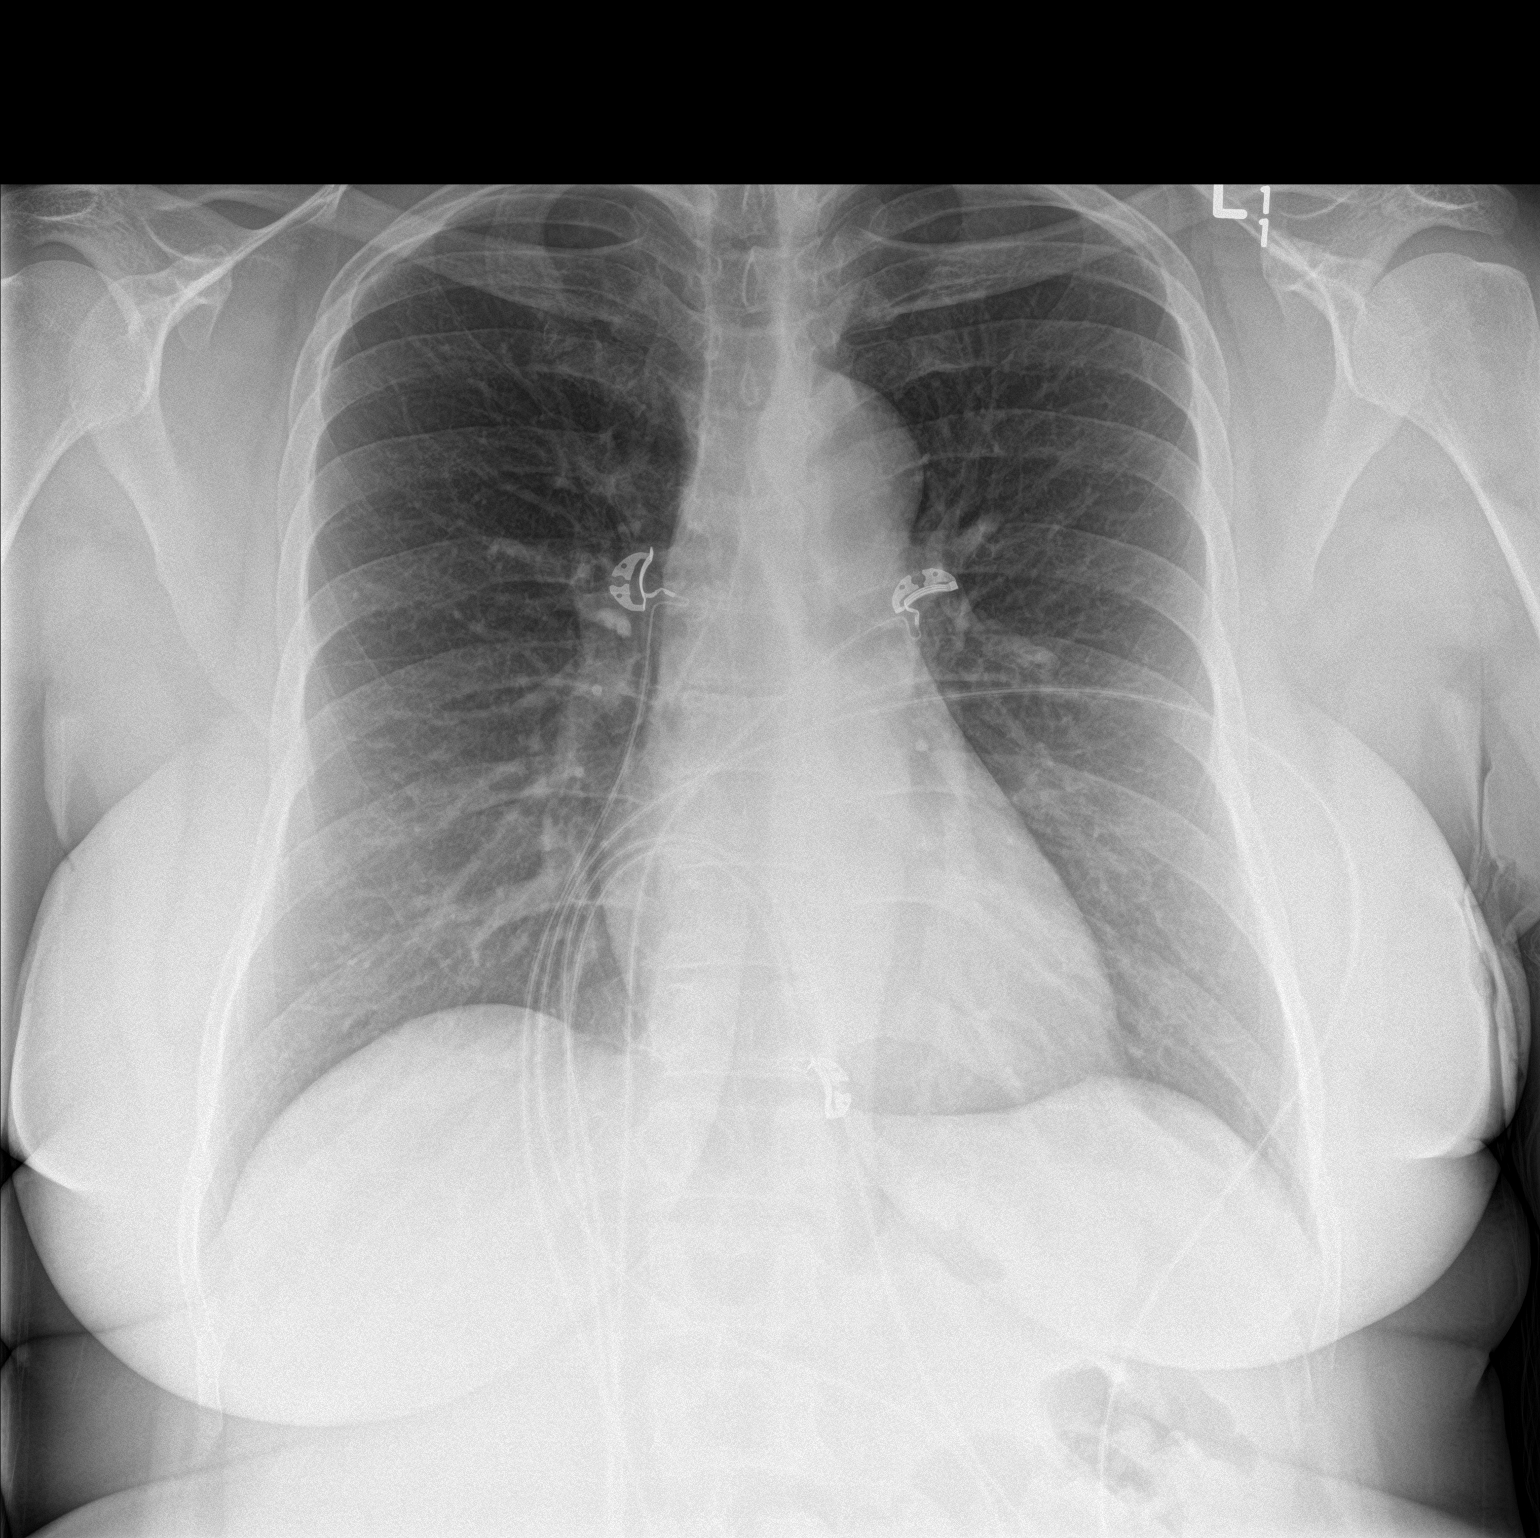

[chest lat]
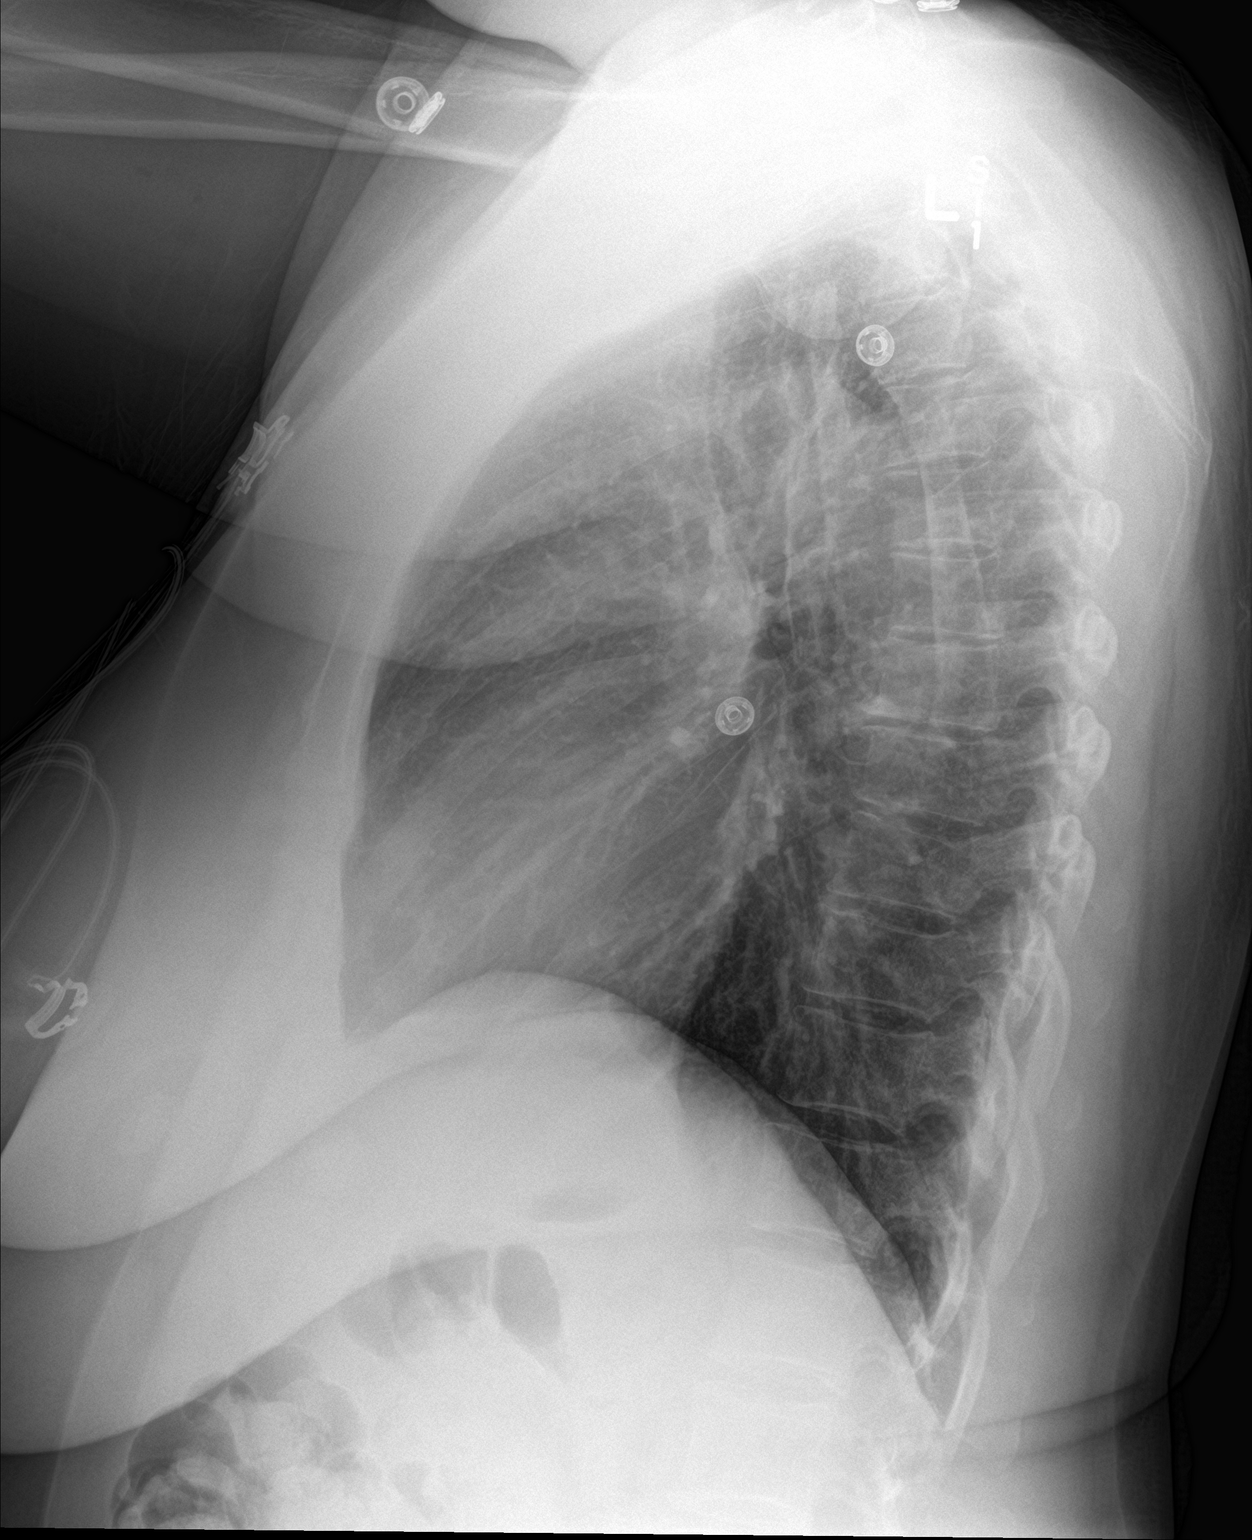

[2 of 2 positions shown; findings below may reference images not displayed]

FINDINGS: The heart size and mediastinal contours are within normal limits.
Both lungs are clear. The visualized skeletal structures are
unremarkable.
IMPRESSION: Normal examination.
